# Patient Record
Sex: Female | Born: 1985
Health system: Southern US, Community
[De-identification: ages and names within clinical notes are randomized; demographics above are authoritative.]

## PROBLEM LIST (undated history)

## (undated) DIAGNOSIS — T7840XA Allergy, unspecified, initial encounter: Secondary | ICD-10-CM

## (undated) DIAGNOSIS — F419 Anxiety disorder, unspecified: Secondary | ICD-10-CM

## (undated) DIAGNOSIS — M797 Fibromyalgia: Secondary | ICD-10-CM

## (undated) HISTORY — DX: Fibromyalgia: M79.7

## (undated) HISTORY — DX: Allergy, unspecified, initial encounter: T78.40XA

## (undated) HISTORY — DX: Anxiety disorder, unspecified: F41.9

---

## 2007-01-18 ENCOUNTER — Emergency Department (HOSPITAL_COMMUNITY): Admission: EM | Admit: 2007-01-18 | Discharge: 2007-01-19 | Payer: Self-pay | Admitting: Emergency Medicine

## 2008-06-10 ENCOUNTER — Emergency Department (HOSPITAL_COMMUNITY): Admission: EM | Admit: 2008-06-10 | Discharge: 2008-06-10 | Payer: Self-pay | Admitting: Emergency Medicine

## 2010-05-15 ENCOUNTER — Encounter: Admission: RE | Admit: 2010-05-15 | Discharge: 2010-05-15 | Payer: Self-pay | Admitting: Internal Medicine

## 2012-05-16 ENCOUNTER — Ambulatory Visit
Admission: RE | Admit: 2012-05-16 | Discharge: 2012-05-16 | Disposition: A | Discharge status: Home / Self Care | Payer: 59 | Source: Ambulatory Visit | Attending: Family Medicine | Admitting: Family Medicine

## 2012-05-16 ENCOUNTER — Other Ambulatory Visit: Payer: Self-pay | Admitting: Family Medicine

## 2012-05-16 ENCOUNTER — Encounter: Payer: Self-pay | Admitting: Family Medicine

## 2012-05-16 ENCOUNTER — Other Ambulatory Visit (HOSPITAL_COMMUNITY)
Admission: RE | Admit: 2012-05-16 | Discharge: 2012-05-16 | Disposition: A | Discharge status: Home / Self Care | Payer: 59 | Source: Ambulatory Visit | Attending: Family Medicine | Admitting: Family Medicine

## 2012-05-16 ENCOUNTER — Ambulatory Visit (INDEPENDENT_AMBULATORY_CARE_PROVIDER_SITE_OTHER): Payer: 59 | Admitting: Family Medicine

## 2012-05-16 VITALS — BP 100/90 | HR 76 | Temp 98.1°F | Ht 67.75 in | Wt 153.0 lb

## 2012-05-16 DIAGNOSIS — R1032 Left lower quadrant pain: Secondary | ICD-10-CM

## 2012-05-16 DIAGNOSIS — G43009 Migraine without aura, not intractable, without status migrainosus: Secondary | ICD-10-CM | POA: Insufficient documentation

## 2012-05-16 DIAGNOSIS — R1031 Right lower quadrant pain: Secondary | ICD-10-CM

## 2012-05-16 DIAGNOSIS — Z01419 Encounter for gynecological examination (general) (routine) without abnormal findings: Secondary | ICD-10-CM | POA: Insufficient documentation

## 2012-05-16 DIAGNOSIS — D219 Benign neoplasm of connective and other soft tissue, unspecified: Secondary | ICD-10-CM

## 2012-05-16 LAB — CBC WITH DIFFERENTIAL/PLATELET
Lymphs Abs: 1.6 10*3/uL (ref 0.7–4.0)
MCHC: 33 g/dL (ref 30.0–36.0)
Monocytes Absolute: 0.3 10*3/uL (ref 0.1–1.0)
Platelets: 288 10*3/uL (ref 150.0–400.0)
RBC: 3.65 Mil/uL — ABNORMAL LOW (ref 3.87–5.11)
RDW: 14.1 % (ref 11.5–14.6)
WBC: 4.6 10*3/uL (ref 4.5–10.5)

## 2012-05-16 LAB — COMPREHENSIVE METABOLIC PANEL
Albumin: 4 g/dL (ref 3.5–5.2)
BUN: 16 mg/dL (ref 6–23)
CO2: 28 mEq/L (ref 19–32)
GFR: 111.71 mL/min (ref >60.00)
Glucose, Bld: 73 mg/dL (ref 70–99)
Potassium: 4.1 mEq/L (ref 3.5–5.1)

## 2012-05-16 NOTE — Patient Instructions (Signed)
Please stop by to see Shirlee Limerick on your way out to set up your ultrasound. We will call you with your lab results, pap smear and ultrasound results.

## 2012-05-16 NOTE — Progress Notes (Signed)
Subjective:    Patient ID: Beth Cox, female    DOB: 1986/08/11, 26 y.o.   MRN: [id number]  HPI  26 yo female here to establish care.  G0, has not been sexually active in over five years.  Overdue for pap smear. Denies any dysuria or vaginal discharge.  Abdominal pain- past 3 years- intermittent typically LLQ pain but sometimes radiates to RLQ.  Per pt, has been to multiple urgent care facilities during the past 3 years for this complaint and all tests, including pelvic ultrasound 3 years ago were neg.  Pain is sometimes random, other time occurs the week before her menstrual period.  "Twisting" feeling.  Sometimes associated with nausea, no vomiting. No abnormal vaginal bleeding, constipation, diarrhea, blood or mucous in stool. Sometimes occurs with a migraine.  Migraines- has one every few months.  Associated with photophobia, nausea but no vomiting.  Patient Active Problem List  Diagnoses  . Right lower quadrant pain  . Gynecological examination  . Left lower quadrant pain  . Migraine   Past Medical History  Diagnosis Date  . Migraine    No past surgical history on file. History  Substance Use Topics  . Smoking status: Never Smoker   . Smokeless tobacco: Not on file  . Alcohol Use: Not on file   No family history on file. No Known Allergies No current outpatient prescriptions on file prior to visit.   The PMH, PSH, Social History, Family History, Medications, and allergies have been reviewed in [facility], and have been updated if relevant.   Review of Systems    See HPI Patient reports no  vision/ hearing changes,anorexia, weight change, fever ,adenopathy, persistant / recurrent hoarseness, swallowing issues, chest pain, edema,persistant / recurrent cough, hemoptysis, dyspnea(rest, exertional, paroxysmal nocturnal), gastrointestinal  bleeding (melena, rectal bleeding), abdominal pain, excessive heart burn, GU symptoms(dysuria, hematuria, pyuria, voiding/incontinence   Issues) syncope, focal weakness, severe memory loss, concerning skin lesions, depression, anxiety, abnormal bruising/bleeding, major joint swelling, breast masses or abnormal vaginal bleeding.    Objective:   Physical Exam BP 100/90  Pulse 76  Temp(Src) 98.1 F (36.7 C) (Oral)  Ht 5' 7.75" (1.721 m)  Wt 153 lb (69.4 kg)  BMI 23.44 kg/m2  LMP 05/08/2012  General:  Well-developed,well-nourished,in no acute distress; alert,appropriate and cooperative throughout examination Head:  normocephalic and atraumatic.   Eyes:  vision grossly intact, pupils equal, pupils round, and pupils reactive to light.   Ears:  R ear normal and L ear normal.   Nose:  no external deformity.   Mouth:  good dentition.   Neck:  No deformities, masses, or tenderness noted. Lungs:  Normal respiratory effort, chest expands symmetrically. Lungs are clear to auscultation, no crackles or wheezes. Heart:  Normal rate and regular rhythm. S1 and S2 normal without gallop, murmur, click, rub or other extra sounds. Abdomen:  Bowel sounds positive,abdomen soft and non-tender without masses, organomegaly or hernias noted. Rectal:  no external abnormalities.   Genitalia:  Pelvic Exam:        External: normal female genitalia without lesions or masses        Vagina: normal without lesions or masses        Cervix: normal without lesions or masses        Adnexa: normal bimanual exam without masses or fullness        Uterus: normal by palpation        Pap smear: performed Msk:  No deformity or scoliosis noted of thoracic or lumbar  spine.   Extremities:  No clubbing, cyanosis, edema, or deformity noted with normal full range of motion of all joints.   Neurologic:  alert & oriented X3 and gait normal.   Skin:  Intact without suspicious lesions or rashes Cervical Nodes:  No lymphadenopathy noted Axillary Nodes:  No palpable lymphadenopathy Psych:  Cognition and judgment appear intact. Alert and cooperative with normal attention  span and concentration. No apparent delusions, illusions, hallucinations     Assessment & Plan:   1. Gynecological examination  Reviewed preventive care protocols, scheduled due services, and updated immunizations   Cytology - PAP  2. Left lower quadrant pain  Differential is very wide- ?possible abdominal migraines since she does have typical migraines as well. ?Ovarian cyst. Unlikely IBD- no bowel changes. Will check CBC, CMET, pelvic ultrasound as initial evaluation.  US Pelvis Complete, US Transvaginal Non-OB CBC with Differential, Comprehensive metabolic panel

## 2012-05-23 ENCOUNTER — Encounter: Payer: Self-pay | Admitting: *Deleted

## 2012-05-23 ENCOUNTER — Encounter: Payer: Self-pay | Admitting: Family Medicine

## 2012-05-23 LAB — HM PAP SMEAR: HM Pap smear: NORMAL

## 2014-07-03 ENCOUNTER — Emergency Department: Payer: Self-pay | Admitting: Emergency Medicine

## 2014-07-03 LAB — COMPREHENSIVE METABOLIC PANEL
ALBUMIN: 3.8 g/dL (ref 3.4–5.0)
ALK PHOS: 55 U/L
ALT: 20 U/L (ref 12–78)
Anion Gap: 8 (ref 7–16)
BILIRUBIN TOTAL: 0.2 mg/dL (ref 0.2–1.0)
BUN: 15 mg/dL (ref 7–18)
CO2: 28 mmol/L (ref 21–32)
Calcium, Total: 9.2 mg/dL (ref 8.5–10.1)
Chloride: 100 mmol/L (ref 98–107)
Creatinine: 0.91 mg/dL (ref 0.60–1.30)
EGFR (Non-African Amer.): >60
GLUCOSE: 81 mg/dL (ref 65–99)
Osmolality: 272 (ref 275–301)
POTASSIUM: 4.1 mmol/L (ref 3.5–5.1)
SGOT(AST): 19 U/L (ref 15–37)
SODIUM: 136 mmol/L (ref 136–145)
TOTAL PROTEIN: 7.9 g/dL (ref 6.4–8.2)

## 2014-07-03 LAB — LIPASE, BLOOD: LIPASE: 278 U/L (ref 73–393)

## 2014-07-04 LAB — CBC WITH DIFFERENTIAL/PLATELET
BASOS ABS: 0 10*3/uL (ref 0.0–0.1)
Basophil %: 0.5 %
Eosinophil #: 0.1 10*3/uL (ref 0.0–0.7)
Eosinophil %: 1 %
HCT: 37.1 % (ref 35.0–47.0)
HGB: 12.3 g/dL (ref 12.0–16.0)
Lymphocyte #: 3.1 10*3/uL (ref 1.0–3.6)
Lymphocyte %: 36.7 %
MCH: 31.2 pg (ref 26.0–34.0)
MCHC: 33.2 g/dL (ref 32.0–36.0)
MCV: 94 fL (ref 80–100)
Monocyte #: 0.5 x10 3/mm (ref 0.2–0.9)
Monocyte %: 6 %
NEUTROS PCT: 55.8 %
Neutrophil #: 4.7 10*3/uL (ref 1.4–6.5)
Platelet: 249 10*3/uL (ref 150–440)
RBC: 3.95 10*6/uL (ref 3.80–5.20)
RDW: 13.4 % (ref 11.5–14.5)
WBC: 8.4 10*3/uL (ref 3.6–11.0)

## 2014-07-04 LAB — URINALYSIS, COMPLETE
Bilirubin,UR: NEGATIVE
Blood: NEGATIVE
GLUCOSE, UR: NEGATIVE mg/dL (ref 0–75)
Ketone: NEGATIVE
Leukocyte Esterase: NEGATIVE
Nitrite: NEGATIVE
PH: 6 (ref 4.5–8.0)
PROTEIN: NEGATIVE
SPECIFIC GRAVITY: 1.014 (ref 1.003–1.030)
WBC UR: NONE SEEN /HPF (ref 0–5)

## 2015-06-04 ENCOUNTER — Encounter: Payer: Self-pay | Admitting: Family Medicine

## 2015-06-04 ENCOUNTER — Ambulatory Visit (INDEPENDENT_AMBULATORY_CARE_PROVIDER_SITE_OTHER): Payer: BLUE CROSS/BLUE SHIELD | Admitting: Family Medicine

## 2015-06-04 ENCOUNTER — Encounter (INDEPENDENT_AMBULATORY_CARE_PROVIDER_SITE_OTHER): Payer: Self-pay

## 2015-06-04 ENCOUNTER — Ambulatory Visit: Payer: BLUE CROSS/BLUE SHIELD | Admitting: Family Medicine

## 2015-06-04 VITALS — BP 112/68 | HR 74 | Temp 97.8°F | Resp 18 | Ht 67.5 in | Wt 159.6 lb

## 2015-06-04 DIAGNOSIS — N911 Secondary amenorrhea: Secondary | ICD-10-CM

## 2015-06-04 DIAGNOSIS — F419 Anxiety disorder, unspecified: Secondary | ICD-10-CM

## 2015-06-04 DIAGNOSIS — N3090 Cystitis, unspecified without hematuria: Secondary | ICD-10-CM | POA: Diagnosis not present

## 2015-06-04 HISTORY — DX: Anxiety disorder, unspecified: F41.9

## 2015-06-04 LAB — POCT URINALYSIS DIPSTICK
Bilirubin, UA: NEGATIVE
Blood, UA: NEGATIVE
GLUCOSE UA: NEGATIVE
KETONES UA: NEGATIVE
Nitrite, UA: NEGATIVE
PH UA: 6.5
Protein, UA: NEGATIVE
Spec Grav, UA: <=1.005
Urobilinogen, UA: 0.2

## 2015-06-04 LAB — POCT URINE PREGNANCY: PREG TEST UR: NEGATIVE

## 2015-06-04 MED ORDER — NITROFURANTOIN MONOHYD MACRO 100 MG PO CAPS: 100.0000 mg | ORAL_CAPSULE | Freq: Two times a day (BID) | ORAL | Status: DC

## 2015-06-04 MED ORDER — ALPRAZOLAM 0.25 MG PO TABS: 0.2500 mg | ORAL_TABLET | Freq: Every evening | ORAL | Status: DC | PRN

## 2015-06-04 NOTE — Progress Notes (Signed)
   Subjective:    Patient ID: Beth Cox, female    DOB: 1986/01/11, 29 y.o.   MRN: [id number]  HPI    Review of Systems     Objective:   Physical Exam        Assessment & Plan:

## 2015-06-04 NOTE — Progress Notes (Signed)
Name: Beth Cox   MRN: [id number]    DOB: 17-Oct-1986   Date:06/04/2015       Progress Note  Subjective  Chief Complaint  Chief Complaint  Patient presents with  . Establish Care  . Chest Pain  . Urinary Tract Infection    nausea, hematuria, right flank pain, headaches, bodyaches    HPI  Dysuria: Patient is here today with concerns regarding the following symptoms burning with urination, dysuria, right flank pain, frequency, hematuria and nausea that started days ago.  Associated with headaches. Not associated with fevers, chills.   Chest Pain: Patient complains of chest pain. Onset was several months ago, with stable course since that time. The patient describes the pain as intermittent, sharp in nature, left shoulder. Patient rates pain as a 3/10 in intensity.  Associated symptoms are none. Aggravating factors are deep inspiration and emotional stress.  Alleviating factors are: NSAIDs, rest and unpredictable spontaneous resolution. Patient's cardiac risk factors are none.  Patient's risk factors for DVT/PE: oral contraceptive use. Previous cardiac testing: chest x-ray, cholesterol, electrocardiogram (ECG), kidney function, potassium, thyroid function, triglycerides and urinalysis.     Past Medical History  Diagnosis Date  . Migraine   . Allergy     History  Substance Use Topics  . Smoking status: Never Smoker   . Smokeless tobacco: Not on file  . Alcohol Use: No     Current outpatient prescriptions:  .  Norgestimate-Ethinyl Estradiol Triphasic 0.18/0.215/0.25 MG-35 MCG tablet, Take by mouth., Disp: , Rfl:   Allergies  Allergen Reactions  . Shellfish Allergy Other (See Comments)    ROS  10 Systems reviewed and is negative except as mentioned in HPI.   Objective  Filed Vitals:   06/04/15 1635  BP: 112/68  Pulse: 74  Temp: 97.8 F (36.6 C)  TempSrc: Oral  Resp: 18  Height: 5' 7.5" (1.715 m)  Weight: 159 lb 9.6 oz (72.394 kg)  SpO2: 95%      Physical Exam  Constitutional: Patient appears well-developed and well-nourished. In no acute distress but does appear to be uncomfortable from acute illness. Cardiovascular: Normal rate, regular rhythm and normal heart sounds.  No murmur heard.  Pulmonary/Chest: Effort normal and breath sounds normal. No respiratory distress. Abdomen: Soft with normal bowel sounds, mild tenderness on deep palpation over suprapubic area, no reproducible flank tenderness bilaterally.  Genitourinary: Exam deferred. Skin: Skin is warm and dry. No rash noted. No erythema.  Psychiatric: Patient has a normal mood and affect. Behavior is normal in office today. Judgment and thought content normal in office today.   Assessment & Plan  1. Cystitis Increase hydration, may use AZO prn. Will send urine for culture along with GC/Chlamydia probe.  - POCT Urinalysis Dipstick - Urine Culture - GC/chlamydia probe amp, urine - nitrofurantoin, macrocrystal-monohydrate, (MACROBID) 100 MG capsule; Take 1 capsule (100 mg total) by mouth 2 (two) times daily.  Dispense: 10 capsule; Refill: 0  2. Anxiety, mild I have reviewed available previous medical records found in the EMR. Madissen's chest pain does not fit cardiac etiology and she has no notable risk factors at this time. Per Cyril Mourning she has had routine blood work done at work yesterday and her cholesterol panel was normal. I have discussed other possible etiologies such as musculoskeletal chest wall pain vs anxiety. Plan to treat with low dose benzo prn as a trial.    Other less likely etiologies I have considered is PE (although her symptoms are too chronic and intermittent).   -  ALPRAZolam (XANAX) 0.25 MG tablet; Take 1 tablet (0.25 mg total) by mouth at bedtime as needed for anxiety.  Dispense: 20 tablet; Refill: 0  3. Amenorrhea, secondary Negative   - POCT urine pregnancy

## 2015-06-05 ENCOUNTER — Other Ambulatory Visit: Payer: Self-pay | Admitting: Family Medicine

## 2015-06-06 LAB — URINE CULTURE

## 2015-06-12 LAB — GC/CHLAMYDIA PROBE AMP
CHLAMYDIA, DNA PROBE: NEGATIVE
NEISSERIA GONORRHOEAE BY PCR: NEGATIVE

## 2015-06-12 LAB — SPECIMEN STATUS REPORT

## 2015-07-21 ENCOUNTER — Encounter (INDEPENDENT_AMBULATORY_CARE_PROVIDER_SITE_OTHER): Payer: Self-pay

## 2015-07-25 ENCOUNTER — Encounter: Payer: Self-pay | Admitting: Family Medicine

## 2015-07-25 ENCOUNTER — Ambulatory Visit (INDEPENDENT_AMBULATORY_CARE_PROVIDER_SITE_OTHER): Payer: BLUE CROSS/BLUE SHIELD | Admitting: Family Medicine

## 2015-07-25 VITALS — BP 110/70 | HR 69 | Temp 98.2°F | Resp 16 | Ht 68.0 in | Wt 160.8 lb

## 2015-07-25 DIAGNOSIS — R5383 Other fatigue: Secondary | ICD-10-CM | POA: Diagnosis not present

## 2015-07-25 DIAGNOSIS — J45909 Unspecified asthma, uncomplicated: Secondary | ICD-10-CM

## 2015-07-25 DIAGNOSIS — R0982 Postnasal drip: Secondary | ICD-10-CM | POA: Diagnosis not present

## 2015-07-25 DIAGNOSIS — R109 Unspecified abdominal pain: Secondary | ICD-10-CM

## 2015-07-25 DIAGNOSIS — R05 Cough: Secondary | ICD-10-CM | POA: Diagnosis not present

## 2015-07-25 DIAGNOSIS — R059 Cough, unspecified: Secondary | ICD-10-CM

## 2015-07-25 DIAGNOSIS — R350 Frequency of micturition: Secondary | ICD-10-CM | POA: Diagnosis not present

## 2015-07-25 DIAGNOSIS — J309 Allergic rhinitis, unspecified: Secondary | ICD-10-CM

## 2015-07-25 MED ORDER — FLUTICASONE PROPIONATE 50 MCG/ACT NA SUSP: 2.0000 | Freq: Every day | NASAL | Status: DC

## 2015-07-25 MED ORDER — LORATADINE 10 MG PO TABS: 10.0000 mg | ORAL_TABLET | Freq: Every day | ORAL | Status: DC

## 2015-07-25 NOTE — Progress Notes (Signed)
Name: Beth Cox   MRN: [id number]    DOB: Sep 02, 1986   Date:07/25/2015       Progress Note  Subjective  Chief Complaint  Chief Complaint  Patient presents with  . Fatigue    patient has been really achy, itchy and has had some rashes to pop out sporadically.  . Cough    patient stated that she has been coughing for over a month and has had some sore throat.    HPI   Beth Cox is a 29 year old health female here today with many complaints which she is not sure is related not. She states she has had a cough for many months, dry and non productive. Not associated with nasal congestion, fevers, chills, SOB, itchy eyes, ear pain, hemoptysis. She has not used any anti-histamines regularly. She also gets itchy skin on and off without overt rash. She continues to have frequent urination which is not always painful. She went to urgent care and they put her on more antibiotics. The urine cx from last month was unremarkable. She wants to see a Dealer. She also feels tired and run down with dry skin.   Patient Active Problem List   Diagnosis Date Noted  . Anxiety, mild 06/04/2015  . Cystitis 06/04/2015  . Amenorrhea, secondary 06/04/2015  . Right lower quadrant pain 05/16/2012  . Gynecological examination 05/16/2012  . Left lower quadrant pain 05/16/2012  . Migraine     History  Substance Use Topics  . Smoking status: Never Smoker   . Smokeless tobacco: Not on file  . Alcohol Use: No     Current outpatient prescriptions:  .  ALPRAZolam (XANAX) 0.25 MG tablet, Take 1 tablet (0.25 mg total) by mouth at bedtime as needed for anxiety., Disp: 20 tablet, Rfl: 0 .  ciprofloxacin (CIPRO) 500 MG tablet, Take 500 mg by mouth., Disp: , Rfl:  .  Norgestimate-Ethinyl Estradiol Triphasic 0.18/0.215/0.25 MG-35 MCG tablet, Take by mouth., Disp: , Rfl:   Allergies  Allergen Reactions  . Shellfish Allergy Other (See Comments)  . Apple   . Peanuts  [Peanut Oil]     Other reaction(s):  SHORTNESS OF BREATH  . Shellfish-Derived Products     Other reaction(s): Other (See Comments)    Review of Systems  CONSTITUTIONAL: No significant weight changes, fever, chills, weakness. Yes fatigue HEENT:  - Eyes: No visual changes.  - Ears: No auditory changes. No pain.  - Nose: No sneezing, congestion, runny nose. - Throat: No sore throat. No changes in swallowing. SKIN: No rash or itching.  CARDIOVASCULAR: No chest pain, chest pressure or chest discomfort. No palpitations or edema.  RESPIRATORY: No shortness of breath. Yes cough. GASTROINTESTINAL: No anorexia, nausea, vomiting. No changes in bowel habits. No abdominal pain or blood.  GENITOURINARY: Yes frequency without pain. NEUROLOGICAL: No headache, dizziness, syncope, paralysis, ataxia, numbness or tingling in the extremities. No memory changes. No change in bowel or bladder control.  MUSCULOSKELETAL: No joint pain. No muscle pain. HEMATOLOGIC: No anemia, bleeding or bruising.  LYMPHATICS: No enlarged lymph nodes.  PSYCHIATRIC: No change in mood. No change in sleep pattern.  ENDOCRINOLOGIC: No reports of sweating, cold or heat intolerance. No polyuria or polydipsia. Yes dryness to skin.  Depression screen [facility] 2/9 06/04/2015  Decreased Interest 0  Down, Depressed, Hopeless 0  PHQ - 2 Score 0    Objective  BP 110/70 mmHg  Pulse 69  Temp(Src) 98.2 F (36.8 C) (Oral)  Resp 16  Ht 5'  8" (1.727 m)  Wt 160 lb 12.8 oz (72.938 kg)  BMI 24.46 kg/m2  SpO2 98%  Body mass index is 24.46 kg/(m^2).   Physical Exam  Constitutional: Patient appears well-developed and well-nourished. In no distress.  HEENT:  - Head: Normocephalic and atraumatic.  - Ears: Bilateral TMs gray, no erythema or effusion - Nose: Nasal mucosa moist boggy and congested. - Mouth/Throat: Oropharynx is clear and moist. No tonsillar hypertrophy or erythema. Yes post nasal drainage.  - Eyes: Conjunctivae clear, EOM movements normal. PERRLA. No scleral  icterus.  Neck: Normal range of motion. Neck supple. No JVD present. No thyromegaly present.  Cardiovascular: Normal rate, regular rhythm and normal heart sounds.  No murmur heard.  Pulmonary/Chest: Effort normal and breath sounds normal. No respiratory distress. Musculoskeletal: Normal range of motion bilateral UE and LE, no joint effusions. Peripheral vascular: Bilateral LE no edema. Neurological: CN II-XII grossly intact with no focal deficits. Alert and oriented to person, place, and time. Coordination, balance, strength, speech and gait are normal.  Skin: Skin is warm and dry. No rash noted. No erythema.  Psychiatric: Patient has a normal mood and affect. Behavior is normal in office today. Judgment and thought content normal in office today.   Recent Results (from the past 2160 hour(s))  POCT Urinalysis Dipstick     Status: Abnormal   Collection Time: 06/04/15  5:01 PM  Result Value Ref Range   Color, UA Yellow    Clarity, UA Clear    Glucose, UA Negative    Bilirubin, UA Negative    Ketones, UA Negative    Spec Grav, UA <=1.005    Blood, UA Negative    pH, UA 6.5    Protein, UA Negative    Urobilinogen, UA 0.2    Nitrite, UA Negative    Leukocytes, UA large (3+)   POCT urine pregnancy     Status: Normal   Collection Time: 06/04/15  5:30 PM  Result Value Ref Range   Preg Test, Ur Negative   Urine Culture     Status: None   Collection Time: 06/05/15 12:00 AM  Result Value Ref Range   Urine Culture, Routine Final report    Result 1 Comment     Comment: Mixed urogenital flora Less than 10,000 colonies/mL   GC/Chlamydia Probe Amp     Status: None   Collection Time: 06/05/15 12:00 AM  Result Value Ref Range   Chlamydia trachomatis, NAA Negative Negative   Neisseria gonorrhoeae by PCR Negative Negative  Specimen status report     Status: None   Collection Time: 06/05/15 12:00 AM  Result Value Ref Range   specimen status report Comment     Comment: Written  Authorization Written Authorization Written Authorization Received. Authorization received from original requisition 06-06-2015 Logged by [place]  1. Flank pain Clinical suspicion for kidney stone and pyelonephritis low but she is quite concerned so I will order an abdominal US.  - DG Abd 2 Views; Future  2. Cough Likely related to allergies. Will have her start flonase and anti-histamine and see allergist near future if this does not help. Will get CXR to rule out mass or inflammation.   - DG Chest 2 View; Future  3. Urinary frequency Unclear etiology. Urine STD check negative and Ucx last mont unremarkable. Will refer to Urology.  - Ambulatory referral to Urology  4. Allergic rhinitis with postnasal drip Start claritin and flonase.  -  loratadine (CLARITIN) 10 MG tablet; Take 1 tablet (10 mg total) by mouth daily.  Dispense: 30 tablet; Refill: 5 - fluticasone (FLONASE) 50 MCG/ACT nasal spray; Place 2 sprays into both nostrils daily.  Dispense: 16 g; Refill: 5  5. Other fatigue Non specific vague symptoms. Clinically she is stable. Will rule out thyroid disorder. Also will keep sub-optimal control of mood disorder in mind.  - T3, free - T4, free - TSH - B12 and Folate Panel

## 2015-07-26 LAB — TSH: TSH: 1.36 u[IU]/mL (ref 0.450–4.500)

## 2015-07-26 LAB — B12 AND FOLATE PANEL
FOLATE: 17.3 ng/mL (ref >3.0)
Vitamin B-12: 611 pg/mL (ref 211–946)

## 2015-07-26 LAB — T4, FREE: FREE T4: 1.4 ng/dL (ref 0.82–1.77)

## 2015-07-26 LAB — T3, FREE: T3, Free: 2.9 pg/mL (ref 2.0–4.4)

## 2015-07-29 ENCOUNTER — Ambulatory Visit
Admission: RE | Admit: 2015-07-29 | Discharge: 2015-07-29 | Disposition: A | Discharge status: Home / Self Care | Payer: BLUE CROSS/BLUE SHIELD | Source: Ambulatory Visit | Attending: Family Medicine | Admitting: Family Medicine

## 2015-07-29 DIAGNOSIS — R05 Cough: Secondary | ICD-10-CM

## 2015-07-29 DIAGNOSIS — R109 Unspecified abdominal pain: Secondary | ICD-10-CM | POA: Insufficient documentation

## 2015-07-29 DIAGNOSIS — R059 Cough, unspecified: Secondary | ICD-10-CM

## 2015-07-30 ENCOUNTER — Encounter: Payer: Self-pay | Admitting: Family Medicine

## 2015-08-19 ENCOUNTER — Ambulatory Visit: Payer: BLUE CROSS/BLUE SHIELD | Admitting: Urology

## 2015-08-19 ENCOUNTER — Encounter: Payer: Self-pay | Admitting: Urology

## 2015-09-15 ENCOUNTER — Encounter: Payer: Self-pay | Admitting: Family Medicine

## 2015-09-23 ENCOUNTER — Encounter: Payer: Self-pay | Admitting: Family Medicine

## 2015-09-23 ENCOUNTER — Ambulatory Visit (INDEPENDENT_AMBULATORY_CARE_PROVIDER_SITE_OTHER): Payer: BLUE CROSS/BLUE SHIELD | Admitting: Family Medicine

## 2015-09-23 VITALS — BP 112/72 | HR 84 | Temp 97.9°F | Resp 16 | Wt 158.8 lb

## 2015-09-23 DIAGNOSIS — F411 Generalized anxiety disorder: Secondary | ICD-10-CM | POA: Diagnosis not present

## 2015-09-23 DIAGNOSIS — M797 Fibromyalgia: Secondary | ICD-10-CM | POA: Diagnosis not present

## 2015-09-23 DIAGNOSIS — G8929 Other chronic pain: Secondary | ICD-10-CM | POA: Diagnosis not present

## 2015-09-23 DIAGNOSIS — R102 Pelvic and perineal pain: Secondary | ICD-10-CM

## 2015-09-23 DIAGNOSIS — K5901 Slow transit constipation: Secondary | ICD-10-CM | POA: Diagnosis not present

## 2015-09-23 DIAGNOSIS — N949 Unspecified condition associated with female genital organs and menstrual cycle: Secondary | ICD-10-CM | POA: Diagnosis not present

## 2015-09-23 HISTORY — DX: Fibromyalgia: M79.7

## 2015-09-23 MED ORDER — DULOXETINE HCL 20 MG PO CPEP: 20.0000 mg | ORAL_CAPSULE | Freq: Every day | ORAL | Status: DC

## 2015-09-23 NOTE — Progress Notes (Signed)
Name: Beth Cox   MRN: [id number]    DOB: 1986-08-09   Date:09/23/2015       Progress Note  Subjective  Chief Complaint  Chief Complaint  Patient presents with  . Fibromyalgia    patient needs official diagnoses    HPI  Beth Cox is a 29 year old healthy female with ongoing concerns regarding symptoms such as fatigue, low motivation, pain of several areas but not necessarily joints. She is quite concerned she may have Fibromyalgia. Has not had official diagnosis however. Patient complains of anxiety disorder and poor motivation .  She has the following symptoms: difficulty concentrating, fatigue, irritable, chest wall pain, neck pain, knee pain, abdominal pain, pelvic pain, low motivation. Onset of symptoms was approximately several years ago, stable since that time. She denies current suicidal and homicidal ideation. Family history significant for none that she can recall. Possible organic causes contributing are: none. Risk factors: none that can be identified, recently married Feb 2016 and reports happy relationship. Previous treatment includes none. Regarding constipation, confirmed on X-ray on 07/29/15. She states she has bowel movements every 2-3 days but may be incomplete. Stools are formed but she states they can be dark with white mucous. Abdominal and pelvic pain are crampy but not sharp or stabbing. She has made appointment with her Gynecologist to address pelvic symptoms.   Active Ambulatory Problems    Diagnosis Date Noted  . Migraine without aura and without status migrainosus, not intractable   . Anxiety, mild 06/04/2015  . Allergic rhinitis with postnasal drip 07/25/2015  . Other fatigue 07/25/2015  . Fibromyalgia syndrome 09/23/2015  . Chronic pelvic pain in female 09/23/2015  . Constipation, slow transit 09/23/2015   Resolved Ambulatory Problems    Diagnosis Date Noted  . Right lower quadrant pain 05/16/2012  . Gynecological examination 05/16/2012  . Left  lower quadrant pain 05/16/2012  . Cystitis 06/04/2015  . Amenorrhea, secondary 06/04/2015  . Urinary frequency 07/25/2015  . Flank pain 07/25/2015  . Cough 07/25/2015   Past Medical History  Diagnosis Date  . Migraine   . Allergy     Social History  Substance Use Topics  . Smoking status: Never Smoker   . Smokeless tobacco: Not on file  . Alcohol Use: No     Current outpatient prescriptions:  .  ALPRAZolam (XANAX) 0.25 MG tablet, Take 1 tablet (0.25 mg total) by mouth at bedtime as needed for anxiety., Disp: 20 tablet, Rfl: 0 .  fluticasone (FLONASE) 50 MCG/ACT nasal spray, Place 2 sprays into both nostrils daily., Disp: 16 g, Rfl: 5 .  loratadine (CLARITIN) 10 MG tablet, Take 1 tablet (10 mg total) by mouth daily., Disp: 30 tablet, Rfl: 5 .  Norgestimate-Ethinyl Estradiol Triphasic 0.18/0.215/0.25 MG-35 MCG tablet, Take by mouth., Disp: , Rfl:   No past surgical history on file.  Family History  Problem Relation Age of Onset  . Depression Mother   . Depression Father     Allergies  Allergen Reactions  . Shellfish Allergy Other (See Comments)  . Apple   . Peanuts  [Peanut Oil]     Other reaction(s): SHORTNESS OF BREATH  . Shellfish-Derived Products     Other reaction(s): Other (See Comments)     Review of Systems  CONSTITUTIONAL: No significant weight changes, fever, chills, weakness. Yes fatigue.  CARDIOVASCULAR: No chest pain, chest pressure or chest discomfort. No palpitations or edema.  RESPIRATORY: No shortness of breath, cough or sputum.  GASTROINTESTINAL: No anorexia, nausea, vomiting.  No diarrhea. Chronic abdominal discomfort with constipation. GENITOURINARY: No dysuria. No frequency. No discharge.  NEUROLOGICAL: No headache, dizziness, syncope, paralysis, ataxia, numbness or tingling in the extremities. No memory changes. No change in bowel or bladder control.  MUSCULOSKELETAL: Yes joint pain. Yes muscle pain. HEMATOLOGIC: No anemia, bleeding or  bruising.  LYMPHATICS: No enlarged lymph nodes.  PSYCHIATRIC: Yes change in mood but not worse than usual. No change in sleep pattern.      Objective  BP 112/72 mmHg  Pulse 84  Temp(Src) 97.9 F (36.6 C) (Oral)  Resp 16  Wt 158 lb 12.8 oz (72.031 kg)  SpO2 96%  LMP 09/16/2015 (Exact Date) Body mass index is 24.15 kg/(m^2).  Physical Exam  Constitutional: Patient appears well-developed and well-nourished. In no distress.  Neck: Normal range of motion. Neck supple. No JVD present. No thyromegaly present.  Cardiovascular: Normal rate, regular rhythm and normal heart sounds.  No murmur heard.  Pulmonary/Chest: Effort normal and breath sounds normal. No respiratory distress. Abdomen: Soft, non tender, non distended, no guarding or rebound, normal bowel sounds.  Musculoskeletal: Normal range of motion bilateral UE and LE, no joint effusions. Bilateral reproducible point tenderness at posterior base of skull, posterior base of neck, at anterior lower neck just above medial clavicles, bilateral knees medial aspect, bilateral upper outer buttock, left greater than right lateral hip. No tenderness at elbows.   Peripheral vascular: Bilateral LE no edema. Neurological: CN II-XII grossly intact with no focal deficits. Alert and oriented to person, place, and time. Coordination, balance, strength, speech and gait are normal.  Skin: Skin is warm and dry. No rash noted. No erythema.  Psychiatric: Patient has a stable mood and affect. Behavior is normal in office today. Judgment and thought content normal in office today.   Assessment & Plan  1. Fibromyalgia syndrome New diagnosis, qualifying positive point testing with 12 out of 18 tender point sites on today's exam with negative routine blood panels although she has not had recent ESR or RA factor on file that I can find. No joint swelling, warmth, restricted ROM. I am less likely to consider RA or OA at this time. CXR done 07/29/15  unremarkable. Discussed new diagnosis, possible component of adjustment disorder and treatment options: Prozac, Zoloft, Wellbutrin, Cymbalta, Savella. Decided on Cymbalta. The patient has been counseled on the proper use, side effects and potential interactions of the new medication. Patient encouraged to review the side effects and safety profile pamphlet provided with the prescription from the pharmacy as well as request counseling from the pharmacy team as needed.    - DULoxetine (CYMBALTA) 20 MG capsule; Take 1 capsule (20 mg total) by mouth daily.  Dispense: 14 capsule; Refill: 0  2. Chronic pelvic pain in female May be related to Fibromyalgia. She is follow up with Gynecologist soon. Other differentials discussed with patient include endometriosis, referred pain from GI issues, ovarian cysts.   3. Constipation, slow transit May be IBS related. If symptoms persist will refer to GI.  4. GAD (generalized anxiety disorder) Likely her fibromyalgia has some component of a mood disorder without overt depressive symptoms.  - DULoxetine (CYMBALTA) 20 MG capsule; Take 1 capsule (20 mg total) by mouth daily.  Dispense: 14 capsule; Refill: 0

## 2015-09-23 NOTE — Patient Instructions (Signed)
Fibromyalgia Fibromyalgia is a disorder that is often misunderstood. It is associated with muscular pains and tenderness that comes and goes. It is often associated with fatigue and sleep disturbances. Though it tends to be long-lasting, fibromyalgia is not life-threatening. CAUSES  The exact cause of fibromyalgia is unknown. People with certain gene types are predisposed to developing fibromyalgia and other conditions. Certain factors can play a role as triggers, such as:  Spine disorders.  Arthritis.  Severe injury (trauma) and other physical stressors.  Emotional stressors. SYMPTOMS   The main symptom is pain and stiffness in the muscles and joints, which can vary over time.  Sleep and fatigue problems. Other related symptoms may include:  Bowel and bladder problems.  Headaches.  Visual problems.  Problems with odors and noises.  Depression or mood changes.  Painful periods (dysmenorrhea).  Dryness of the skin or eyes. DIAGNOSIS  There are no specific tests for diagnosing fibromyalgia. Patients can be diagnosed accurately from the specific symptoms they have. The diagnosis is made by determining that nothing else is causing the problems. TREATMENT  There is no cure. Management includes medicines and an active, healthy lifestyle. The goal is to enhance physical fitness, decrease pain, and improve sleep. HOME CARE INSTRUCTIONS   Only take over-the-counter or prescription medicines as directed by your caregiver. Sleeping pills, tranquilizers, and pain medicines may make your problems worse.  Low-impact aerobic exercise is very important and advised for treatment. At first, it may seem to make pain worse. Gradually increasing your tolerance will overcome this feeling.  Learning relaxation techniques and how to control stress will help you. Biofeedback, visual imagery, hypnosis, muscle relaxation, yoga, and meditation are all options.  Anti-inflammatory medicines and  physical therapy may provide short-term help.  Acupuncture or massage treatments may help.  Take muscle relaxant medicines as suggested by your caregiver.  Avoid stressful situations.  Plan a healthy lifestyle. This includes your diet, sleep, rest, exercise, and friends.  Find and practice a hobby you enjoy.  Join a fibromyalgia support group for interaction, ideas, and sharing advice. This may be helpful. SEEK MEDICAL CARE IF:  You are not having good results or improvement from your treatment. FOR MORE INFORMATION  National Fibromyalgia Association: www.fmaware.org Arthritis Foundation: www.arthritis.org Document Released: 12/13/2005 Document Revised: 03/06/2012 Document Reviewed: 03/25/2010 ExitCare Patient Information 2015 [employer]. This information is not intended to replace advice given to you by your health care provider. Make sure you discuss any questions you have with your health care provider.  

## 2016-06-24 ENCOUNTER — Encounter: Payer: BLUE CROSS/BLUE SHIELD | Admitting: Family Medicine

## 2016-07-15 ENCOUNTER — Ambulatory Visit (INDEPENDENT_AMBULATORY_CARE_PROVIDER_SITE_OTHER): Payer: 59 | Admitting: Family Medicine

## 2016-07-15 ENCOUNTER — Encounter: Payer: Self-pay | Admitting: Family Medicine

## 2016-07-15 VITALS — BP 104/78 | HR 87 | Temp 99.3°F | Resp 14 | Wt 164.0 lb

## 2016-07-15 DIAGNOSIS — M2550 Pain in unspecified joint: Secondary | ICD-10-CM | POA: Diagnosis not present

## 2016-07-15 DIAGNOSIS — N926 Irregular menstruation, unspecified: Secondary | ICD-10-CM

## 2016-07-15 DIAGNOSIS — I739 Peripheral vascular disease, unspecified: Secondary | ICD-10-CM | POA: Diagnosis not present

## 2016-07-15 DIAGNOSIS — R8290 Unspecified abnormal findings in urine: Secondary | ICD-10-CM

## 2016-07-15 DIAGNOSIS — R5383 Other fatigue: Secondary | ICD-10-CM

## 2016-07-15 DIAGNOSIS — R1032 Left lower quadrant pain: Secondary | ICD-10-CM

## 2016-07-15 DIAGNOSIS — N910 Primary amenorrhea: Secondary | ICD-10-CM | POA: Diagnosis not present

## 2016-07-15 LAB — POCT URINALYSIS DIPSTICK
Bilirubin, UA: NEGATIVE
Blood, UA: NEGATIVE
Glucose, UA: NEGATIVE
Ketones, UA: NEGATIVE
Nitrite, UA: NEGATIVE
PH UA: 5.5
PROTEIN UA: NEGATIVE
SPEC GRAV UA: 1.02
UROBILINOGEN UA: 0.2

## 2016-07-15 LAB — POCT URINE PREGNANCY: PREG TEST UR: NEGATIVE

## 2016-07-15 MED ORDER — NITROFURANTOIN MONOHYD MACRO 100 MG PO CAPS: 100.0000 mg | ORAL_CAPSULE | Freq: Two times a day (BID) | ORAL | Status: AC

## 2016-07-15 NOTE — Assessment & Plan Note (Signed)
Check labs 

## 2016-07-15 NOTE — Patient Instructions (Signed)
Start the antibiotic Get plenty of hydration We'll get labs today Try turmeric as a natural anti-inflammatory (for pain and arthritis). It comes in capsules where you buy aspirin and fish oil, but also as a spice where you buy pepper and garlic powder. Start aleve 1-2 pills (220 to 440 mg) every 12 hours for the next 5 days, and then just when needed, take with food

## 2016-07-15 NOTE — Progress Notes (Signed)
BP 104/78   Pulse 87   Temp 99.3 F (37.4 C) (Oral)   Resp 14   Wt 164 lb (74.4 kg)   LMP 07/08/2016 (Approximate)   SpO2 93%   BMI 24.94 kg/m     Subjective:    Patient ID: Beth Cox, female    DOB: July 30, 1986, 30 y.o.   MRN: [id number]  HPI: Beth Cox is a 30 y.o. female  Chief Complaint  Patient presents with  . Back Pain    frequent urination, lower abdominal pain  . skin irritation    itching  . Fatigue  . Fibromyalgia    joint pain   She has been having lower back pain and abdominal pain; LMP was 8 days late but finally come on, came on for only 3 days, usually longer cycle; no burning with urination; no blood in the urine; no kidneys personally or in family She has itching all over; ribs hurt across the top; no hives or red bumps; she has noticed something in the front of the chest, might be necklace; okay to wear jewelry for the most part She has had some pain in her chest; breast pain and chest; she just started noticing that a month or two ago; can feel it in the breasts; middle of the chest, like sharp and aching, sometimes under arms; no change with breathing or laughing; sometimes sore to the touch Acid reflux and gagging sensation in the throat; going on for a month Joint pain; mostly in the neck and shoulders, also in elbows and knees and hips and wrists; feels like she is 30 year old; no known family hx of RA; no known fam hx of rheumatoid disease; tick bite as a child; nothing recently; having mosquito bites, lit up the other day Fatigue; some mornings just tired, feels rough in the mornings some times; takes a light lunch, takes nap during lunch break over the last year; goes tot he bathroom a ton, 4-5 x a night; dry mouth  Depression screen [facility] 2/9 07/15/2016 09/23/2015 06/04/2015  Decreased Interest 0 0 0  Down, Depressed, Hopeless 0 0 0  PHQ - 2 Score 0 0 0   Relevant past medical, surgical, family and social history reviewed Past Medical  History:  Diagnosis Date  . Allergy   . Anxiety, mild 06/04/2015  . Fibromyalgia syndrome 09/23/2015  . Migraine    History reviewed. No pertinent surgical history.  No surgery, verified  Family History  Problem Relation Age of Onset  . Depression Mother   . Depression Father   . Breast cancer Neg Hx   MD notes: no thyroid disease, no diabetes; grandfather has had a few types of cancer, heart attack, 30 years old and still kicking  Social History  Substance Use Topics  . Smoking status: Never Smoker  . Smokeless tobacco: Not on file  . Alcohol use No   Interim medical history since last visit reviewed. Allergies and medications reviewed  Review of Systems Per HPI unless specifically indicated above     Objective:    BP 104/78   Pulse 87   Temp 99.3 F (37.4 C) (Oral)   Resp 14   Wt 164 lb (74.4 kg)   LMP 07/08/2016 (Approximate)   SpO2 93%   BMI 24.94 kg/m    Wt Readings from Last 3 Encounters:  08/02/16 164 lb 3.2 oz (74.5 kg)  07/15/16 164 lb (74.4 kg)  09/23/15 158 lb 12.8 oz (72 kg)  Physical Exam  Constitutional: She appears well-developed and well-nourished. No distress.  HENT:  Head: Normocephalic and atraumatic.  Eyes: EOM are normal. No scleral icterus.  Neck: No thyromegaly present.  Cardiovascular: Normal rate, regular rhythm and normal heart sounds.   No murmur heard. Distal hands cool to the touch  Pulmonary/Chest: Effort normal and breath sounds normal. No respiratory distress. She has no wheezes.  Abdominal: Soft. Bowel sounds are normal. She exhibits no distension.  Musculoskeletal: Normal range of motion. She exhibits no edema.  Neurological: She is alert. She exhibits normal muscle tone.  Skin: Skin is warm and dry. She is not diaphoretic. No pallor.  Psychiatric: She has a normal mood and affect. Her behavior is normal. Judgment and thought content normal.      Assessment & Plan:   Problem List Items Addressed This Visit       Cardiovascular and Mediastinum   Peripheral vascular disease, unspecified ([place])   Relevant Orders   ANA,IFA RA Diag Pnl w/rflx Tit/Patn (Completed)     Other   Other fatigue    Check labs      Relevant Orders   VITAMIN D 25 Hydroxy (Vit-D Deficiency, Fractures) (Completed)   Vitamin B12 (Completed)   TSH (Completed)   Menstrual period late   Relevant Orders   POCT urine pregnancy (Completed)   LLQ abdominal pain   Relevant Orders   CBC with Differential/Platelet (Completed)   Comprehensive metabolic panel (Completed)    Other Visit Diagnoses    Arthralgia of multiple sites, bilateral    -  Primary   Relevant Orders   ANA,IFA RA Diag Pnl w/rflx Tit/Patn (Completed)   C-reactive protein (Completed)   B. burgdorfi antibodies   ENA 9 Panel   Bad odor of urine       Relevant Orders   POCT urinalysis dipstick (Completed)   Urine Culture (Completed)   Left lower quadrant pain       Relevant Orders   POCT urinalysis dipstick (Completed)   Urine Culture (Completed)   CBC with Differential/Platelet (Completed)   Comprehensive metabolic panel (Completed)      Follow up plan: Return in about 2 weeks (around 07/29/2016) for lab review and further work-up.  An after-visit summary was printed and given to the patient at [place].  Please see the patient instructions which may contain other information and recommendations beyond what is mentioned above in the assessment and plan.  Meds ordered this encounter  Medications  . nitrofurantoin, macrocrystal-monohydrate, (MACROBID) 100 MG capsule    Sig: Take 1 capsule (100 mg total) by mouth 2 (two) times daily.    Dispense:  6 capsule    Refill:  0    Orders Placed This Encounter  Procedures  . Urine Culture  . ANA,IFA RA Diag Pnl w/rflx Tit/Patn  . C-reactive protein  . B. burgdorfi antibodies  . CBC with Differential/Platelet  . Comprehensive metabolic panel  . VITAMIN D 25 Hydroxy (Vit-D Deficiency, Fractures)  . Vitamin  B12  . TSH  . ENA 9 Panel  . Anti-DNA antibody, double-stranded  . Lyme Ab/Western Blot Reflex  . Ribosomal P Protein Ab  . Centromere Antibodies  . Anti-scleroderma antibody  . Jo-1 antibody-IgG  . Sjogrens syndrome-A extractable nuclear antibody  . Sjogrens syndrome-B extractable nuclear antibody  . Anti-Smith antibody  . Anti-ribonucleic acid antibody  . Anti-nuclear ab-titer (ANA titer)  . POCT urine pregnancy  . POCT urinalysis dipstick

## 2016-07-16 LAB — CBC WITH DIFFERENTIAL/PLATELET
BASOS ABS: 53 {cells}/uL (ref 0–200)
Basophils Relative: 1 %
EOS PCT: 1 %
Eosinophils Absolute: 53 cells/uL (ref 15–500)
HCT: 36.9 % (ref 35.0–45.0)
HEMOGLOBIN: 11.9 g/dL (ref 11.7–15.5)
LYMPHS ABS: 1961 {cells}/uL (ref 850–3900)
Lymphocytes Relative: 37 %
MCH: 28.9 pg (ref 27.0–33.0)
MCHC: 32.2 g/dL (ref 32.0–36.0)
MCV: 89.6 fL (ref 80.0–100.0)
MONOS PCT: 6 %
MPV: 10.9 fL (ref 7.5–12.5)
Monocytes Absolute: 318 cells/uL (ref 200–950)
NEUTROS ABS: 2915 {cells}/uL (ref 1500–7800)
NEUTROS PCT: 55 %
PLATELETS: 338 10*3/uL (ref 140–400)
RBC: 4.12 MIL/uL (ref 3.80–5.10)
RDW: 14.3 % (ref 11.0–15.0)
WBC: 5.3 10*3/uL (ref 3.8–10.8)

## 2016-07-16 LAB — COMPREHENSIVE METABOLIC PANEL
ALK PHOS: 59 U/L (ref 33–115)
ALT: 22 U/L (ref 6–29)
AST: 26 U/L (ref 10–30)
Albumin: 4.3 g/dL (ref 3.6–5.1)
BILIRUBIN TOTAL: 0.4 mg/dL (ref 0.2–1.2)
BUN: 10 mg/dL (ref 7–25)
CO2: 26 mmol/L (ref 20–31)
CREATININE: 0.85 mg/dL (ref 0.50–1.10)
Calcium: 9.3 mg/dL (ref 8.6–10.2)
Chloride: 102 mmol/L (ref 98–110)
GLUCOSE: 75 mg/dL (ref 65–99)
Potassium: 4.2 mmol/L (ref 3.5–5.3)
SODIUM: 137 mmol/L (ref 135–146)
Total Protein: 7.5 g/dL (ref 6.1–8.1)

## 2016-07-16 LAB — RIBOSOMAL P PROTEIN AB: Ribosomal P Protein Ab: <1

## 2016-07-16 LAB — TSH: TSH: 1.07 mIU/L

## 2016-07-16 LAB — SJOGRENS SYNDROME-B EXTRACTABLE NUCLEAR ANTIBODY: SSB (La) (ENA) Antibody, IgG: <1

## 2016-07-16 LAB — ANTI-SMITH ANTIBODY: ENA SM AB SER-ACNC: NEGATIVE

## 2016-07-16 LAB — ANTI-NUCLEAR AB-TITER (ANA TITER): ANA Titer 1: 1:40 {titer} — ABNORMAL HIGH

## 2016-07-16 LAB — VITAMIN B12: Vitamin B-12: 633 pg/mL (ref 200–1100)

## 2016-07-16 LAB — VITAMIN D 25 HYDROXY (VIT D DEFICIENCY, FRACTURES): Vit D, 25-Hydroxy: 29 ng/mL — ABNORMAL LOW (ref 30–100)

## 2016-07-16 LAB — ANA,IFA RA DIAG PNL W/RFLX TIT/PATN
Anti Nuclear Antibody(ANA): POSITIVE — AB
Rhuematoid fact SerPl-aCnc: <10 IU/mL (ref <=14)

## 2016-07-16 LAB — ANTI-SCLERODERMA ANTIBODY: SCLERODERMA (SCL-70) (ENA) ANTIBODY, IGG: NEGATIVE

## 2016-07-16 LAB — JO-1 ANTIBODY-IGG: JO-1 ANTIBODY, IGG: NEGATIVE

## 2016-07-16 LAB — SJOGRENS SYNDROME-A EXTRACTABLE NUCLEAR ANTIBODY: SSA (RO) (ENA) ANTIBODY, IGG: NEGATIVE

## 2016-07-16 LAB — ANTI-RIBONUCLEIC ACID ANTIBODY: SM/RNP: NEGATIVE

## 2016-07-16 LAB — LYME AB/WESTERN BLOT REFLEX: B burgdorferi Ab IgG+IgM: <0.9 Index (ref <0.90)

## 2016-07-16 LAB — C-REACTIVE PROTEIN

## 2016-07-16 LAB — CENTROMERE ANTIBODIES: Centromere Ab Screen: <1

## 2016-07-16 LAB — ANTI-DNA ANTIBODY, DOUBLE-STRANDED: ds DNA Ab: 4 IU/mL

## 2016-07-17 LAB — URINE CULTURE

## 2016-07-22 ENCOUNTER — Other Ambulatory Visit: Payer: Self-pay | Admitting: Family Medicine

## 2016-07-22 DIAGNOSIS — R768 Other specified abnormal immunological findings in serum: Secondary | ICD-10-CM

## 2016-07-22 NOTE — Assessment & Plan Note (Signed)
Refer to rheum 

## 2016-08-02 ENCOUNTER — Encounter: Payer: Self-pay | Admitting: Family Medicine

## 2016-08-02 ENCOUNTER — Ambulatory Visit (INDEPENDENT_AMBULATORY_CARE_PROVIDER_SITE_OTHER): Payer: 59 | Admitting: Family Medicine

## 2016-08-02 DIAGNOSIS — N949 Unspecified condition associated with female genital organs and menstrual cycle: Secondary | ICD-10-CM | POA: Diagnosis not present

## 2016-08-02 DIAGNOSIS — G8929 Other chronic pain: Secondary | ICD-10-CM | POA: Diagnosis not present

## 2016-08-02 DIAGNOSIS — N63 Unspecified lump in breast: Secondary | ICD-10-CM | POA: Diagnosis not present

## 2016-08-02 DIAGNOSIS — R350 Frequency of micturition: Secondary | ICD-10-CM | POA: Insufficient documentation

## 2016-08-02 DIAGNOSIS — R768 Other specified abnormal immunological findings in serum: Secondary | ICD-10-CM

## 2016-08-02 DIAGNOSIS — N644 Mastodynia: Secondary | ICD-10-CM

## 2016-08-02 DIAGNOSIS — R102 Pelvic and perineal pain: Secondary | ICD-10-CM

## 2016-08-02 DIAGNOSIS — N6323 Unspecified lump in the left breast, lower outer quadrant: Secondary | ICD-10-CM

## 2016-08-02 DIAGNOSIS — N6320 Unspecified lump in the left breast, unspecified quadrant: Secondary | ICD-10-CM | POA: Insufficient documentation

## 2016-08-02 NOTE — Patient Instructions (Addendum)
Please do see the rheumatologist We'll get you in to see the urologist Avoid / limit caffeine as much as possible Avoid tea and chocolate as well We'll get the breast studies Let us know about any changes in her breast health so we can do additional studies or get you in to see a surgeon for evaluation

## 2016-08-02 NOTE — Progress Notes (Signed)
BP 102/64   Pulse 80   Temp 99 F (37.2 C) (Oral)   Resp 14   Wt 164 lb 3.2 oz (74.5 kg)   LMP 07/08/2016 (Approximate)   SpO2 92%   BMI 24.97 kg/m    Subjective:    Patient ID: Beth Cox, female    DOB: Jan 17, 1986, 30 y.o.   MRN: [id number]  HPI: Beth Cox is a 30 y.o. female  Chief Complaint  Patient presents with  . Follow-up    2 weeks   She has had multiple labs drawn and she is here to go over symptoms She does not have any pain in the center of her abdomen or suprapubic area; still doesn't feel normal; having urinary frequency, but that's been going on for a few years; she was told that might have a small bladder; dealing with this for years and it affects her QOL Goes to see rheum on Thursday for positive ANA Vit D was a little low; taking prenatal vitamin and 1,000 vitamin D No new symptoms (new aches or rash) She felt a little bump in the left breast; knows they can come and go; have felt sore for a few months, on and off with the cycles; comes and goes No nipple discharge; has some discomfort in both breasts; sharp pain in the breasts, both sides, more on the left  Relevant past medical, surgical, family and social history reviewed Past Medical History:  Diagnosis Date  . Allergy   . Anxiety, mild 06/04/2015  . Fibromyalgia syndrome 09/23/2015  . Migraine    No past surgical history on file. Family History  Problem Relation Age of Onset  . Depression Mother   . Depression Father    Social History  Substance Use Topics  . Smoking status: Never Smoker  . Smokeless tobacco: Not on file  . Alcohol use No   Interim medical history since last visit reviewed. Allergies and medications reviewed  Review of Systems Per HPI unless specifically indicated above     Objective:    BP 102/64   Pulse 80   Temp 99 F (37.2 C) (Oral)   Resp 14   Wt 164 lb 3.2 oz (74.5 kg)   LMP 07/08/2016 (Approximate)   SpO2 92%   BMI 24.97 kg/m   Wt  Readings from Last 3 Encounters:  08/02/16 164 lb 3.2 oz (74.5 kg)  07/15/16 164 lb (74.4 kg)  09/23/15 158 lb 12.8 oz (72 kg)    Physical Exam  Constitutional: She appears well-developed and well-nourished. No distress.  Eyes: EOM are normal. No scleral icterus.  Neck: No thyromegaly present.  Cardiovascular: Normal rate.   Pulmonary/Chest: Effort normal. Right breast exhibits no inverted nipple, no mass, no nipple discharge, no skin change and no tenderness. Left breast exhibits mass (soft mobile lesion between 5 and 6 o'clock LEFT breast, about size of lima bean). Left breast exhibits no inverted nipple, no nipple discharge, no skin change and no tenderness. Breasts are symmetrical.  Abdominal: She exhibits no distension.  Skin: No rash noted. No pallor.  Psychiatric: She has a normal mood and affect. Her behavior is normal. Judgment and thought content normal. Her mood appears not anxious. She does not exhibit a depressed mood.   Results for orders placed or performed in visit on 07/15/16  Urine Culture  Result Value Ref Range   Colony Count 50,000-100,000 CFU/mL    Organism ID, Bacteria Three or more organisms present,each greater than  Organism ID, Bacteria 10,000 CFU/mL.These organisms,commonly found on    Organism ID, Bacteria external and internal genitalia,are considered to    Organism ID, Bacteria be colonizers.No further testing performed.   ANA,IFA RA Diag Pnl w/rflx Tit/Patn  Result Value Ref Range   Rhuematoid fact SerPl-aCnc <10 <=14 IU/mL   Anit Nuclear Antibody(ANA) POS (A) NEGATIVE   Cyclic Citrullin Peptide Ab <16 Units  C-reactive protein  Result Value Ref Range   CRP <0.5 <0.60 mg/dL  CBC with Differential/Platelet  Result Value Ref Range   WBC 5.3 3.8 - 10.8 K/uL   RBC 4.12 3.80 - 5.10 MIL/uL   Hemoglobin 11.9 11.7 - 15.5 g/dL   HCT 36.9 35.0 - 45.0 %   MCV 89.6 80.0 - 100.0 fL   MCH 28.9 27.0 - 33.0 pg   MCHC 32.2 32.0 - 36.0 g/dL   RDW 14.3 11.0 -  15.0 %   Platelets 338 140 - 400 K/uL   MPV 10.9 7.5 - 12.5 fL   Neutro Abs 2,915 1,500 - 7,800 cells/uL   Lymphs Abs 1,961 850 - 3,900 cells/uL   Monocytes Absolute 318 200 - 950 cells/uL   Eosinophils Absolute 53 15 - 500 cells/uL   Basophils Absolute 53 0 - 200 cells/uL   Neutrophils Relative % 55 %   Lymphocytes Relative 37 %   Monocytes Relative 6 %   Eosinophils Relative 1 %   Basophils Relative 1 %   Smear Review Criteria for review not met   Comprehensive metabolic panel  Result Value Ref Range   Sodium 137 135 - 146 mmol/L   Potassium 4.2 3.5 - 5.3 mmol/L   Chloride 102 98 - 110 mmol/L   CO2 26 20 - 31 mmol/L   Glucose, Bld 75 65 - 99 mg/dL   BUN 10 7 - 25 mg/dL   Creat 0.85 0.50 - 1.10 mg/dL   Total Bilirubin 0.4 0.2 - 1.2 mg/dL   Alkaline Phosphatase 59 33 - 115 U/L   AST 26 10 - 30 U/L   ALT 22 6 - 29 U/L   Total Protein 7.5 6.1 - 8.1 g/dL   Albumin 4.3 3.6 - 5.1 g/dL   Calcium 9.3 8.6 - 10.2 mg/dL  VITAMIN D 25 Hydroxy (Vit-D Deficiency, Fractures)  Result Value Ref Range   Vit D, 25-Hydroxy 29 (L) 30 - 100 ng/mL  Vitamin B12  Result Value Ref Range   Vitamin B-12 633 200 - 1,100 pg/mL  TSH  Result Value Ref Range   TSH 1.07 mIU/L  Anti-DNA antibody, double-stranded  Result Value Ref Range   ds DNA Ab 4 IU/mL  Lyme Ab/Western Blot Reflex  Result Value Ref Range   B burgdorferi Ab IgG+IgM <0.90 <0.90 Index  Ribosomal P Protein Ab  Result Value Ref Range   Ribosomal P Protein Ab <1.0 NEG <1.0 NEG AI  Centromere Antibodies  Result Value Ref Range   Centromere Ab Screen <1.0 NEG <1.0 NEG AI  Anti-scleroderma antibody  Result Value Ref Range   Scleroderma (Scl-70) (ENA) Antibody, IgG <1.0 NEG <1.0 NEG AI  Jo-1 antibody-IgG  Result Value Ref Range   Jo-1 Antibody, IgG <1.0 NEG <1.0 NEG AI  Sjogrens syndrome-A extractable nuclear antibody  Result Value Ref Range   SSA (Ro) (ENA) Antibody, IgG <1.0 NEG <1.0 NEG AI  Sjogrens syndrome-B extractable  nuclear antibody  Result Value Ref Range   SSB (La) (ENA) Antibody, IgG <1.0 NEG <1.0 NEG AI  Anti-Smith antibody  Result  Value Ref Range   ENA SM Ab Ser-aCnc <1.0 NEG <1.0 NEG AI  Anti-ribonucleic acid antibody  Result Value Ref Range   SM/RNP <1.0 NEG <1.0 NEG AI  Anti-nuclear ab-titer (ANA titer)  Result Value Ref Range   ANA Pattern 1 HOMOGENEOUS (A)    ANA Titer 1 1:40 (H) titer  POCT urine pregnancy  Result Value Ref Range   Preg Test, Ur Negative Negative  POCT urinalysis dipstick  Result Value Ref Range   Color, UA light yellow    Clarity, UA clear    Glucose, UA neg    Bilirubin, UA neg    Ketones, UA neg    Spec Grav, UA 1.020    Blood, UA neg    pH, UA 5.5    Protein, UA neg    Urobilinogen, UA 0.2    Nitrite, UA neg    Leukocytes, UA moderate (2+) (A) Negative      Assessment & Plan:   Problem List Items Addressed This Visit      Other   Urinary frequency    Refer to urologist for urinary frequency going on for years; limit caffeine or eliminate completely      Positive ANA (antinuclear antibody)    Discussed that this is a very equivocal/low test finding, and rheumatology may not find this significant; however, with her nail findings and other symptoms, I do agree with having her evaluated by rheumatologist      Chronic pelvic pain in female    With urinary frequency; will have her see urologist to see if urologic etiology for her discomfort      Breast tenderness in female    Check [place]; try evening primrose oil      Relevant Orders   US BREAST COMPLETE UNI LEFT INC AXILLA   US BREAST COMPLETE UNI RIGHT INC AXILLA   Breast lump on left side at 5 o'clock position    Order [place]; explained that I am glad she told me about this; present for 3 months, need to get it checked out; could be cystic lesion, fibroadenoma, but still need to check out any lump at all in the breast      Relevant Orders   US BREAST COMPLETE UNI LEFT INC AXILLA   US BREAST  COMPLETE UNI RIGHT INC AXILLA    Other Visit Diagnoses   None.      Follow up plan: Return if symptoms worsen or fail to improve.  An after-visit summary was printed and given to the patient at [place].  Please see the patient instructions which may contain other information and recommendations beyond what is mentioned above in the assessment and plan.  No orders of the defined types were placed in this encounter.   Orders Placed This Encounter  Procedures  . US BREAST COMPLETE UNI LEFT INC AXILLA  . US BREAST COMPLETE UNI RIGHT INC AXILLA

## 2016-08-02 NOTE — Assessment & Plan Note (Addendum)
Order [place]; explained that I am glad she told me about this; present for 3 months, need to get it checked out; could be cystic lesion, fibroadenoma, but still need to check out any lump at all in the breast

## 2016-08-02 NOTE — Assessment & Plan Note (Signed)
Refer to urologist for urinary frequency going on for years; limit caffeine or eliminate completely

## 2016-08-02 NOTE — Assessment & Plan Note (Signed)
Check [place]; try evening primrose oil

## 2016-08-03 NOTE — Assessment & Plan Note (Signed)
Discussed that this is a very equivocal/low test finding, and rheumatology may not find this significant; however, with her nail findings and other symptoms, I do agree with having her evaluated by rheumatologist

## 2016-08-03 NOTE — Assessment & Plan Note (Signed)
With urinary frequency; will have her see urologist to see if urologic etiology for her discomfort

## 2016-08-18 ENCOUNTER — Ambulatory Visit
Admission: RE | Admit: 2016-08-18 | Discharge: 2016-08-18 | Disposition: A | Discharge status: Home / Self Care | Payer: 59 | Source: Ambulatory Visit | Attending: Family Medicine | Admitting: Family Medicine

## 2016-08-18 ENCOUNTER — Ambulatory Visit: Admission: RE | Admit: 2016-08-18 | Payer: 59 | Source: Ambulatory Visit

## 2016-08-18 ENCOUNTER — Other Ambulatory Visit: Payer: Self-pay

## 2016-08-18 ENCOUNTER — Other Ambulatory Visit: Payer: Self-pay | Admitting: Family Medicine

## 2016-08-18 DIAGNOSIS — N63 Unspecified lump in breast: Secondary | ICD-10-CM | POA: Diagnosis not present

## 2016-08-18 DIAGNOSIS — N6320 Unspecified lump in the left breast, unspecified quadrant: Secondary | ICD-10-CM

## 2017-01-19 ENCOUNTER — Telehealth: Payer: Self-pay | Admitting: Family Medicine

## 2017-01-19 DIAGNOSIS — N6323 Unspecified lump in the left breast, lower outer quadrant: Secondary | ICD-10-CM

## 2017-01-19 NOTE — Telephone Encounter (Signed)
Copied from previous report: Recommend follow-up left breast diagnostic mammogram and ultrasound in 6 months to ensure stability. ------------------------------- Diagnostic mammo and [place] ordered by Dr. Sanda Klein; expected date 02/18/17  Orders Placed This Encounter  Procedures  . MM Digital Diagnostic Unilat L    Standing Status:   Future    Standing Expiration Date:   05/18/2017    Order Specific Question:   Reason for Exam (SYMPTOM  OR DIAGNOSIS REQUIRED)    Answer:   abnormal mammogram and [place] in August 2017; due for 6 month f/u    Order Specific Question:   Is the patient pregnant?    Answer:   No    Comments:   you will have to ask her    Order Specific Question:   Preferred imaging location?    Answer:   [facility]  . US BREAST LTD UNI LEFT INC AXILLA    Standing Status:   Future    Standing Expiration Date:   05/18/2017    Order Specific Question:   Reason for Exam (SYMPTOM  OR DIAGNOSIS REQUIRED)    Answer:   abnormal mammo and [place] LEFT breast; due for 6 month f/u    Order Specific Question:   Preferred imaging location?    Answer:   [facility]

## 2017-01-19 NOTE — Assessment & Plan Note (Signed)
Due for 6 month imaging in February

## 2017-01-19 NOTE — Telephone Encounter (Signed)
-----   Message from Arnetha Courser, MD sent at 08/19/2016  4:08 PM EDT ----- Regarding: 6 month breast imaging due in Feb LEFT breast imaging due in FEB 2018

## 2017-04-08 DIAGNOSIS — N390 Urinary tract infection, site not specified: Secondary | ICD-10-CM | POA: Diagnosis not present

## 2017-04-08 DIAGNOSIS — R1084 Generalized abdominal pain: Secondary | ICD-10-CM | POA: Diagnosis not present

## 2017-04-21 ENCOUNTER — Other Ambulatory Visit: Payer: Self-pay | Admitting: Family Medicine

## 2017-04-21 DIAGNOSIS — R109 Unspecified abdominal pain: Secondary | ICD-10-CM

## 2017-04-21 DIAGNOSIS — R52 Pain, unspecified: Secondary | ICD-10-CM

## 2017-04-27 ENCOUNTER — Ambulatory Visit
Admission: RE | Admit: 2017-04-27 | Discharge: 2017-04-27 | Disposition: A | Discharge status: Home / Self Care | Payer: 59 | Source: Ambulatory Visit | Attending: Family Medicine | Admitting: Family Medicine

## 2017-04-27 DIAGNOSIS — R52 Pain, unspecified: Secondary | ICD-10-CM

## 2017-04-27 DIAGNOSIS — R109 Unspecified abdominal pain: Secondary | ICD-10-CM | POA: Diagnosis not present

## 2017-04-27 DIAGNOSIS — R10813 Right lower quadrant abdominal tenderness: Secondary | ICD-10-CM | POA: Diagnosis not present

## 2017-05-02 DIAGNOSIS — R1084 Generalized abdominal pain: Secondary | ICD-10-CM | POA: Diagnosis not present

## 2017-05-16 DIAGNOSIS — R1084 Generalized abdominal pain: Secondary | ICD-10-CM | POA: Diagnosis not present

## 2017-05-16 DIAGNOSIS — R1032 Left lower quadrant pain: Secondary | ICD-10-CM | POA: Diagnosis not present

## 2017-05-16 DIAGNOSIS — R1031 Right lower quadrant pain: Secondary | ICD-10-CM | POA: Diagnosis not present

## 2017-05-18 ENCOUNTER — Other Ambulatory Visit: Payer: Self-pay

## 2017-05-18 DIAGNOSIS — N6323 Unspecified lump in the left breast, lower outer quadrant: Secondary | ICD-10-CM

## 2017-05-18 DIAGNOSIS — N644 Mastodynia: Secondary | ICD-10-CM

## 2017-08-24 ENCOUNTER — Other Ambulatory Visit: Payer: Self-pay

## 2017-08-24 DIAGNOSIS — N6323 Unspecified lump in the left breast, lower outer quadrant: Secondary | ICD-10-CM

## 2017-08-24 DIAGNOSIS — N644 Mastodynia: Secondary | ICD-10-CM

## 2017-08-24 NOTE — Progress Notes (Addendum)
I called [place] and spoke with Melissa to get this patient scheduled but since she did not have her 6 month f/u for a repeat scan, she now has to have the full imaging performed.  New orders has been placed.   Patient has been scheduled to have this performed on Thursday, September 22, 2017 @ 2:00pm.   Patient would like to be called after 3:30pm.  She can call [facility] at [phone], if that date and time is not good.  Patient was informed of the this appointment via voicemail.

## 2017-09-22 ENCOUNTER — Other Ambulatory Visit: Payer: 59

## 2017-12-13 ENCOUNTER — Ambulatory Visit: Payer: 59 | Admitting: Family Medicine

## 2017-12-13 ENCOUNTER — Encounter: Payer: Self-pay | Admitting: Family Medicine

## 2017-12-13 VITALS — BP 116/66 | HR 81 | Temp 98.5°F | Resp 14 | Wt 159.2 lb

## 2017-12-13 DIAGNOSIS — K219 Gastro-esophageal reflux disease without esophagitis: Secondary | ICD-10-CM | POA: Diagnosis not present

## 2017-12-13 DIAGNOSIS — R110 Nausea: Secondary | ICD-10-CM | POA: Diagnosis not present

## 2017-12-13 DIAGNOSIS — N926 Irregular menstruation, unspecified: Secondary | ICD-10-CM | POA: Diagnosis not present

## 2017-12-13 DIAGNOSIS — R1013 Epigastric pain: Secondary | ICD-10-CM | POA: Diagnosis not present

## 2017-12-13 DIAGNOSIS — N6323 Unspecified lump in the left breast, lower outer quadrant: Secondary | ICD-10-CM

## 2017-12-13 LAB — POCT URINE PREGNANCY: PREG TEST UR: NEGATIVE

## 2017-12-13 MED ORDER — OMEPRAZOLE 20 MG PO CPDR: 20.0000 mg | DELAYED_RELEASE_CAPSULE | Freq: Every day | ORAL | 1 refills | Status: DC

## 2017-12-13 NOTE — Assessment & Plan Note (Addendum)
Order mammo and [place]; reviewed last study with patient, urged her to f/u on this
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
## 2017-12-13 NOTE — Patient Instructions (Addendum)
We'll get labs today and contact you Try to limit or avoid triggers like coffee, caffeinated beverages, onions, chocolate, spicy foods, peppermint, acidic foods like pizza, spaghetti sauce, and orange juice Lose weight if you are overweight or obese Try elevating the head of your bed by placing a small wedge between your mattress and box springs to keep acid in the stomach at night instead of coming up into your esophagus We'll get the breast imaging at the end of the week

## 2017-12-14 LAB — CBC WITH DIFFERENTIAL/PLATELET
Basophils Absolute: 50 {cells}/uL (ref 0–200)
Basophils Relative: 0.9 %
Eosinophils Absolute: 73 {cells}/uL (ref 15–500)
Eosinophils Relative: 1.3 %
HCT: 36.1 % (ref 35.0–45.0)
Hemoglobin: 12 g/dL (ref 11.7–15.5)
Lymphs Abs: 2324 {cells}/uL (ref 850–3900)
MCH: 29.9 pg (ref 27.0–33.0)
MCHC: 33.2 g/dL (ref 32.0–36.0)
MCV: 89.8 fL (ref 80.0–100.0)
MPV: 10.8 fL (ref 7.5–12.5)
Monocytes Relative: 7.7 %
Neutro Abs: 2722 {cells}/uL (ref 1500–7800)
Neutrophils Relative %: 48.6 %
Platelets: 303 Thousand/uL (ref 140–400)
RBC: 4.02 Million/uL (ref 3.80–5.10)
RDW: 11.8 % (ref 11.0–15.0)
Total Lymphocyte: 41.5 %
WBC mixed population: 431 {cells}/uL (ref 200–950)
WBC: 5.6 Thousand/uL (ref 3.8–10.8)

## 2017-12-14 LAB — COMPLETE METABOLIC PANEL WITH GFR
AG RATIO: 1.4 (calc) (ref 1.0–2.5)
ALKALINE PHOSPHATASE (APISO): 55 U/L (ref 33–115)
ALT: 13 U/L (ref 6–29)
AST: 17 U/L (ref 10–30)
Albumin: 4.1 g/dL (ref 3.6–5.1)
BILIRUBIN TOTAL: 0.3 mg/dL (ref 0.2–1.2)
BUN: 11 mg/dL (ref 7–25)
CO2: 28 mmol/L (ref 20–32)
Calcium: 9.4 mg/dL (ref 8.6–10.2)
Chloride: 105 mmol/L (ref 98–110)
Creat: 0.91 mg/dL (ref 0.50–1.10)
GFR, Est African American: 97 mL/min/{1.73_m2} (ref >=60)
GFR, Est Non African American: 84 mL/min/{1.73_m2} (ref >=60)
GLOBULIN: 2.9 g/dL (ref 1.9–3.7)
Glucose, Bld: 85 mg/dL (ref 65–99)
POTASSIUM: 4.7 mmol/L (ref 3.5–5.3)
SODIUM: 138 mmol/L (ref 135–146)
Total Protein: 7 g/dL (ref 6.1–8.1)

## 2017-12-14 LAB — TSH: TSH: 0.71 mIU/L

## 2017-12-14 LAB — HCG, SERUM, QUALITATIVE: Preg, Serum: NEGATIVE

## 2017-12-14 LAB — T3, FREE: T3 FREE: 2.8 pg/mL (ref 2.3–4.2)

## 2017-12-14 LAB — T4, FREE: FREE T4: 1.3 ng/dL (ref 0.8–1.8)

## 2017-12-14 LAB — H. PYLORI BREATH TEST: H. pylori Breath Test: NOT DETECTED

## 2017-12-14 NOTE — Assessment & Plan Note (Signed)
Checked urine hCG; will get serum hCG and check thyroid function; patient to f/u with her OB-GYN

## 2018-01-26 DIAGNOSIS — N925 Other specified irregular menstruation: Secondary | ICD-10-CM | POA: Diagnosis not present

## 2018-01-26 DIAGNOSIS — Z3169 Encounter for other general counseling and advice on procreation: Secondary | ICD-10-CM | POA: Diagnosis not present

## 2018-02-24 DIAGNOSIS — N970 Female infertility associated with anovulation: Secondary | ICD-10-CM | POA: Diagnosis not present

## 2018-02-24 DIAGNOSIS — Z3149 Encounter for other procreative investigation and testing: Secondary | ICD-10-CM | POA: Diagnosis not present

## 2018-04-04 DIAGNOSIS — Z3169 Encounter for other general counseling and advice on procreation: Secondary | ICD-10-CM | POA: Diagnosis not present

## 2018-04-04 DIAGNOSIS — Z3149 Encounter for other procreative investigation and testing: Secondary | ICD-10-CM | POA: Diagnosis not present

## 2018-04-04 DIAGNOSIS — N970 Female infertility associated with anovulation: Secondary | ICD-10-CM | POA: Diagnosis not present

## 2018-04-20 DIAGNOSIS — Z3149 Encounter for other procreative investigation and testing: Secondary | ICD-10-CM | POA: Diagnosis not present

## 2018-05-24 DIAGNOSIS — Z3149 Encounter for other procreative investigation and testing: Secondary | ICD-10-CM | POA: Diagnosis not present

## 2018-06-20 ENCOUNTER — Other Ambulatory Visit: Payer: Self-pay | Admitting: Obstetrics and Gynecology

## 2018-06-20 DIAGNOSIS — Z3149 Encounter for other procreative investigation and testing: Secondary | ICD-10-CM

## 2018-06-23 ENCOUNTER — Encounter: Payer: Self-pay | Admitting: Family Medicine

## 2018-06-25 DIAGNOSIS — J0100 Acute maxillary sinusitis, unspecified: Secondary | ICD-10-CM | POA: Diagnosis not present

## 2018-07-13 ENCOUNTER — Ambulatory Visit
Admission: RE | Admit: 2018-07-13 | Discharge: 2018-07-13 | Disposition: A | Discharge status: Home / Self Care | Payer: 59 | Source: Ambulatory Visit | Attending: Obstetrics and Gynecology | Admitting: Obstetrics and Gynecology

## 2018-07-13 ENCOUNTER — Other Ambulatory Visit: Payer: Self-pay | Admitting: Obstetrics and Gynecology

## 2018-07-13 ENCOUNTER — Encounter: Payer: Self-pay | Admitting: Obstetrics and Gynecology

## 2018-07-13 DIAGNOSIS — Z3149 Encounter for other procreative investigation and testing: Secondary | ICD-10-CM | POA: Diagnosis not present

## 2018-07-13 LAB — PREGNANCY, URINE: PREG TEST UR: NEGATIVE

## 2018-07-13 MED ORDER — IOPAMIDOL (ISOVUE-370) INJECTION 76%
20.0000 mL | Freq: Once | INTRAVENOUS | Status: AC | PRN
  Administered 2018-07-13: 20 mL

## 2018-07-13 NOTE — Progress Notes (Unsigned)
@  PATIENTID@[id number]   1987/04/32 y.o. 07/13/18 Benjaman Kindler, MD  Hysterosalpingogram Procedure Note  Date of procedure: 07/13/2018   Pre-operative Diagnosis: Infertility  Post-operative Diagnosis: same, left tube patent, right tube not visualized.  Procedure: Hysterosalpingogram  Surgeon: Angelina Pih, MD  Assistant(s):  Radiology assistant. The radiologist present for today read the imaging and agreed with findings below.  Anesthesia: None  Estimated Blood Loss:  None         Complications:  None; patient tolerated the procedure well.         Disposition: To home         Condition: stable  Findings: Left-sided fill and spill of the tube on left and no spill on the right. No hydrosalpinx noted.  Normal endometrial contour was noted.  Procedure Details  HSG procedure discussed with the patient.  Risks, complications, alternatives have been reviewed with her and she agrees to proceed.   The patient presented to the radiology lab and was identified as the correct patient and the procedure verified as an HSG. A verbal Time Out was held with all team members present and in agreement.  Speculum was inserted in to the vagina and the cervix visualized.  The cervix was cleaned with betadine solution. The HSG catheter was inserted and the balloon insufflated with approximately 1.5 ml of air.  Patient was then repositioned for fluoroscopy.  A total of 15 ml of contrast was used for the procedure. The patient tolerated the procedure well, no complications.    Results were reviewed with the patient at the time of the procedure. She verbalized understanding.   Benjaman Kindler, MD 07/13/2018

## 2018-07-13 NOTE — Progress Notes (Signed)
HSG today, orders placed

## 2018-08-08 ENCOUNTER — Ambulatory Visit: Payer: 59 | Admitting: Family Medicine

## 2018-08-08 ENCOUNTER — Encounter: Payer: Self-pay | Admitting: Family Medicine

## 2018-08-08 VITALS — BP 102/64 | HR 78 | Temp 98.4°F | Resp 12 | Ht 67.5 in | Wt 162.7 lb

## 2018-08-08 DIAGNOSIS — K921 Melena: Secondary | ICD-10-CM | POA: Diagnosis not present

## 2018-08-08 DIAGNOSIS — R195 Other fecal abnormalities: Secondary | ICD-10-CM | POA: Diagnosis not present

## 2018-08-08 DIAGNOSIS — M797 Fibromyalgia: Secondary | ICD-10-CM

## 2018-08-08 DIAGNOSIS — R1013 Epigastric pain: Secondary | ICD-10-CM

## 2018-08-08 MED ORDER — PRENATAL 27-0.8 MG PO TABS: 1.0000 | ORAL_TABLET | Freq: Every day | ORAL | 0 refills | Status: DC

## 2018-08-08 NOTE — Assessment & Plan Note (Signed)
Triggered by cold environments; I agree with patient being able to control her ambient temperature while working; letter written

## 2018-08-08 NOTE — Patient Instructions (Addendum)
Try turmeric as a natural anti-inflammatory (for pain and arthritis). It comes in capsules where you buy aspirin and fish oil, but also as a spice where you buy pepper and garlic powder. We'll have you see the gastroenterologist
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
## 2018-09-22 ENCOUNTER — Telehealth: Payer: 59 | Admitting: Family

## 2018-09-22 DIAGNOSIS — N390 Urinary tract infection, site not specified: Secondary | ICD-10-CM

## 2018-09-22 MED ORDER — CEPHALEXIN 500 MG PO CAPS: 500.0000 mg | ORAL_CAPSULE | Freq: Two times a day (BID) | ORAL | 0 refills | Status: DC

## 2018-09-22 NOTE — Progress Notes (Signed)

## 2018-09-25 ENCOUNTER — Ambulatory Visit: Payer: 59 | Admitting: Gastroenterology

## 2018-10-05 ENCOUNTER — Encounter: Payer: Self-pay | Admitting: Gastroenterology

## 2018-10-05 ENCOUNTER — Ambulatory Visit (INDEPENDENT_AMBULATORY_CARE_PROVIDER_SITE_OTHER): Payer: 59 | Admitting: Gastroenterology

## 2018-10-05 VITALS — BP 102/75 | HR 88 | Resp 17 | Ht 67.5 in | Wt 163.4 lb

## 2018-10-05 DIAGNOSIS — R1084 Generalized abdominal pain: Secondary | ICD-10-CM | POA: Diagnosis not present

## 2018-10-05 DIAGNOSIS — K5909 Other constipation: Secondary | ICD-10-CM

## 2018-10-05 MED ORDER — AMITRIPTYLINE HCL 25 MG PO TABS: 25.0000 mg | ORAL_TABLET | Freq: Every day | ORAL | 0 refills | Status: DC

## 2018-10-05 NOTE — Progress Notes (Signed)
Cephas Darby, MD [address]  [place]  [place], [place] [address]  Main: [phone]  Fax: [phone]    Gastroenterology Consultation  Referring Provider:     Arnetha Courser, MD Primary Care Physician:  Arnetha Courser, MD Primary Gastroenterologist:  Dr. Cephas Darby Reason for Consultation:     Blood and mucus in stool, chronic constipation, abdominal pain        HPI:   Beth Cox is a 32 y.o. female referred by Dr. Arnetha Courser, MD  for consultation & management of symptoms described as below, ongoing for more than a year Chronic abdominal pain, cramping, squeezing type of pain, more or less generalized.  Also, noticing mucus and intermittent specks of blood in stool Tend to be constipated, Incorporated more fiber in her diet which helped to go regularly but sometimes her stools are hard Generally stressful, does notice correlation with worsening of her GI symptoms with stress Denies weight loss, no LOA Patient did not identify any particular food triggers H. pylori breath test was negative, routine labs unremarkable She had an ultrasound abdomen in 2018 unremarkable She does exercise regularly No Tob or ETOH use Patient has fibromyalgia and currently not on any medication She does full-time job as a Futures trader, and part-time photography She and her husband have been trying to have kids in last 3 years NSAIDs: none  Antiplts/Anticoagulants/Anti thrombotics: none  GI Procedures: None She denies family history of GI malignancy She denies family history of inflammatory bowel disease  Past Medical History:  Diagnosis Date  . Allergy   . Anxiety, mild 06/04/2015  . Fibromyalgia syndrome 09/23/2015  . Migraine     No past surgical history on file.   Current Outpatient Medications:  .  amitriptyline (ELAVIL) 25 MG tablet, Take 1 tablet (25 mg total) by mouth at bedtime., Disp: 30 tablet, Rfl: 0 .  cephALEXin (KEFLEX) 500 MG  capsule, Take 1 capsule (500 mg total) by mouth 2 (two) times daily. (Patient not taking: Reported on 10/05/2018), Disp: 14 capsule, Rfl: 0 .  Prenatal Vit-Fe Fumarate-FA (MULTIVITAMIN-PRENATAL) 27-0.8 MG TABS tablet, Take 1 tablet by mouth daily at 12 noon. (Patient not taking: Reported on 10/05/2018), Disp: 30 each, Rfl: 0  Family History  Problem Relation Age of Onset  . Depression Mother   . Depression Father   . Breast cancer Neg Hx      Social History   Tobacco Use  . Smoking status: Never Smoker  . Smokeless tobacco: Never Used  Substance Use Topics  . Alcohol use: No    Alcohol/week: 0.0 standard drinks  . Drug use: No    Allergies as of 10/05/2018 - Review Complete 10/05/2018  Allergen Reaction Noted  . Shellfish allergy Other (See Comments) 06/04/2015  . Apple  07/25/2015  . Peanuts  [peanut oil]  07/25/2015  . Shellfish-derived products  07/25/2015    Review of Systems:    All systems reviewed and negative except where noted in HPI.   Physical Exam:  BP 102/75 (BP Location: Left Arm, Patient Position: Sitting, Cuff Size: Large)   Pulse 88   Resp 17   Ht 5' 7.5" (1.715 m)   Wt 163 lb 6.4 oz (74.1 kg)   BMI 25.21 kg/m  No LMP recorded.  General:   Alert,  Well-developed, well-nourished, pleasant and cooperative in NAD Head:  Normocephalic and atraumatic. Eyes:  Sclera clear, no icterus.   Conjunctiva pink. Ears:  Normal auditory acuity. Nose:  No deformity, discharge, or lesions. Mouth:  No deformity or lesions,oropharynx pink & moist. Neck:  Supple; no masses or thyromegaly. Lungs:  Respirations even and unlabored.  Clear throughout to auscultation.   No wheezes, crackles, or rhonchi. No acute distress. Heart:  Regular rate and rhythm; no murmurs, clicks, rubs, or gallops. Abdomen:  Normal bowel sounds. Soft, non-tender and non-distended without masses, hepatosplenomegaly or hernias noted.  No guarding or rebound tenderness.   Rectal: Not performed Msk:   Symmetrical without gross deformities. Good, equal movement & strength bilaterally. Pulses:  Normal pulses noted. Extremities:  No clubbing or edema.  No cyanosis. Neurologic:  Alert and oriented x3;  grossly normal neurologically. Skin:  Intact without significant lesions or rashes. No jaundice. Lymph Nodes:  No significant cervical adenopathy. Psych:  Alert and cooperative. Normal mood and affect.  Imaging Studies: Reviewed  Assessment and Plan:   Beth Cox is a 32 y.o. African-American female with history of fibromyalgia, presents with chronic symptoms of generalized abdominal pain, trace amount of blood and mucus in stool, chronic constipation.  Her symptoms are mostly functional associated with stress and fibromyalgia.  I am not worried about recent months of blood in her stool which are only intermittent.  Counseled her about management of stress and fibromyalgia and the association with functional GI disorders.  Discussed about different management options including stress stabilization techniques, trial of tricyclic antidepressants.  Patient is willing to try amitriptyline.  Discussed with her about management of constipation as well.  Reassured her that her symptoms are very likely functional If she continues to have blood in stool, will perform flexible sigmoidoscopy and patient is agreeable  Follow up in 1 month   Cephas Darby, MD

## 2018-10-05 NOTE — Patient Instructions (Signed)
High-Fiber Diet  Fiber, also called dietary fiber, is a type of carbohydrate found in fruits, vegetables, whole grains, and beans. A high-fiber diet can have many health benefits. Your health care provider may recommend a high-fiber diet to help:  · Prevent constipation. Fiber can make your bowel movements more regular.  · Lower your cholesterol.  · Relieve hemorrhoids, uncomplicated diverticulosis, or irritable bowel syndrome.  · Prevent overeating as part of a weight-loss plan.  · Prevent heart disease, type 2 diabetes, and certain cancers.    What is my plan?  The recommended daily intake of fiber includes:  · 38 grams for men under age 50.  · 30 grams for men over age 50.  · 25 grams for women under age 50.  · 21 grams for women over age 50.    You can get the recommended daily intake of dietary fiber by eating a variety of fruits, vegetables, grains, and beans. Your health care provider may also recommend a fiber supplement if it is not possible to get enough fiber through your diet.  What do I need to know about a high-fiber diet?  · Fiber supplements have not been widely studied for their effectiveness, so it is better to get fiber through food sources.  · Always check the fiber content on the nutrition facts label of any prepackaged food. Look for foods that contain at least 5 grams of fiber per serving.  · Ask your dietitian if you have questions about specific foods that are related to your condition, especially if those foods are not listed in the following section.  · Increase your daily fiber consumption gradually. Increasing your intake of dietary fiber too quickly may cause bloating, cramping, or gas.  · Drink plenty of water. Water helps you to digest fiber.  What foods can I eat?  Grains  Whole-grain breads. Multigrain cereal. Oats and oatmeal. Brown rice. Barley. Bulgur wheat. Millet. Bran muffins. Popcorn. Rye wafer crackers.  Vegetables   Sweet potatoes. Spinach. Kale. Artichokes. Cabbage. Broccoli. Green peas. Carrots. Squash.  Fruits  Berries. Pears. Apples. Oranges. Avocados. Prunes and raisins. Dried figs.  Meats and Other Protein Sources  Navy, kidney, pinto, and soy beans. Split peas. Lentils. Nuts and seeds.  Dairy  Fiber-fortified yogurt.  Beverages  Fiber-fortified soy milk. Fiber-fortified orange juice.  Other  Fiber bars.  The items listed above may not be a complete list of recommended foods or beverages. Contact your dietitian for more options.  What foods are not recommended?  Grains  White bread. Pasta made with refined flour. White rice.  Vegetables  Fried potatoes. Canned vegetables. Well-cooked vegetables.  Fruits  Fruit juice. Cooked, strained fruit.  Meats and Other Protein Sources  Fatty cuts of meat. Fried poultry or fried fish.  Dairy  Milk. Yogurt. Cream cheese. Sour cream.  Beverages  Soft drinks.  Other  Cakes and pastries. Butter and oils.  The items listed above may not be a complete list of foods and beverages to avoid. Contact your dietitian for more information.  What are some tips for including high-fiber foods in my diet?  · Eat a wide variety of high-fiber foods.  · Make sure that half of all grains consumed each day are whole grains.  · Replace breads and cereals made from refined flour or white flour with whole-grain breads and cereals.  · Replace white rice with brown rice, bulgur wheat, or millet.  · Start the day with a breakfast that is high in fiber,   such as a cereal that contains at least 5 grams of fiber per serving.  · Use beans in place of meat in soups, salads, or pasta.  · Eat high-fiber snacks, such as berries, raw vegetables, nuts, or popcorn.  This information is not intended to replace advice given to you by your health care provider. Make sure you discuss any questions you have with your health care provider.  Document Released: 12/13/2005 Document Revised: 05/20/2016 Document Reviewed: 05/28/2014   Elsevier Interactive Patient Education © 2018 [employer]

## 2018-11-07 ENCOUNTER — Ambulatory Visit: Payer: 59 | Admitting: Gastroenterology

## 2018-11-13 DIAGNOSIS — Z3201 Encounter for pregnancy test, result positive: Secondary | ICD-10-CM | POA: Diagnosis not present

## 2018-12-11 ENCOUNTER — Telehealth: Payer: Self-pay | Admitting: Family Medicine

## 2018-12-11 DIAGNOSIS — N6320 Unspecified lump in the left breast, unspecified quadrant: Secondary | ICD-10-CM

## 2018-12-11 NOTE — Telephone Encounter (Signed)
Called patient to make sure she gets additional orders, she states is going to hold off for now due to being pregnant.  I told her the importance of this and be sure to discuss with OB gyn

## 2018-12-11 NOTE — Telephone Encounter (Signed)
I am going through old / expired orders Please contact patient and ask her to get follow-up studies done ASAP, order whatever [facility] thinks is approprate now that she is so far out; i'm going to think that they will want bilateral diag plus bilateral US  -----------------------------------------------------------  From previous report: RECOMMENDATION: Follow-up LEFT breast diagnostic mammogram and ultrasound in 6 months.  I have discussed the findings and recommendations with the patient. Results were also provided in writing at the conclusion of the visit. If applicable, a reminder letter will be sent to the patient regarding the next appointment.  BI-RADS CATEGORY  3: Probably benign.   Electronically Signed   By: Franki Cabot M.D.   On: 08/18/2016 10:30

## 2018-12-11 NOTE — Telephone Encounter (Signed)
At the very least, we will urge her to get the ultrasounds done which have no radiation; please call her back Also, please contact her OB-GYN and make sure that doctor is aware of the need for follow-up breast imaging so they can coordinate or do other testing appropriate during her pregnancy Also, update med list, pregnancy status, and close pap smear care gap (if she has had pap smear with GYN, we need that for the chart please) Thank you

## 2018-12-12 NOTE — Telephone Encounter (Signed)
Pt notified, she states will discuss with obgyn first before having any imaging.  I also called [facility] ob-gyn and left message for her provider to follow-up on this with patient. Last pap was 2014 and added to chart.

## 2018-12-22 DIAGNOSIS — R05 Cough: Secondary | ICD-10-CM | POA: Diagnosis not present

## 2018-12-22 DIAGNOSIS — Z8719 Personal history of other diseases of the digestive system: Secondary | ICD-10-CM | POA: Diagnosis not present

## 2018-12-27 LAB — HM PAP SMEAR: HM Pap smear: NORMAL

## 2019-01-05 DIAGNOSIS — Z3401 Encounter for supervision of normal first pregnancy, first trimester: Secondary | ICD-10-CM | POA: Diagnosis not present

## 2019-01-05 LAB — OB RESULTS CONSOLE RUBELLA ANTIBODY, IGM: Rubella: IMMUNE

## 2019-01-05 LAB — OB RESULTS CONSOLE HEPATITIS B SURFACE ANTIGEN: Hepatitis B Surface Ag: NEGATIVE

## 2019-01-05 LAB — HM PAP SMEAR: HM Pap smear: NEGATIVE

## 2019-01-05 LAB — OB RESULTS CONSOLE VARICELLA ZOSTER ANTIBODY, IGG: Varicella: IMMUNE

## 2019-01-05 LAB — RESULTS CONSOLE HPV: CHL HPV: NEGATIVE

## 2019-01-08 DIAGNOSIS — O3680X0 Pregnancy with inconclusive fetal viability, not applicable or unspecified: Secondary | ICD-10-CM | POA: Diagnosis not present

## 2019-02-19 DIAGNOSIS — Z3402 Encounter for supervision of normal first pregnancy, second trimester: Secondary | ICD-10-CM | POA: Diagnosis not present

## 2019-03-19 DIAGNOSIS — L299 Pruritus, unspecified: Secondary | ICD-10-CM | POA: Diagnosis not present

## 2019-03-19 DIAGNOSIS — O99712 Diseases of the skin and subcutaneous tissue complicating pregnancy, second trimester: Secondary | ICD-10-CM | POA: Diagnosis not present

## 2019-04-16 DIAGNOSIS — Z23 Encounter for immunization: Secondary | ICD-10-CM | POA: Diagnosis not present

## 2019-04-16 DIAGNOSIS — Z3400 Encounter for supervision of normal first pregnancy, unspecified trimester: Secondary | ICD-10-CM | POA: Diagnosis not present

## 2019-06-22 LAB — OB RESULTS CONSOLE RPR: RPR: NONREACTIVE

## 2019-06-22 LAB — OB RESULTS CONSOLE GC/CHLAMYDIA
Chlamydia: NEGATIVE
Gonorrhea: NEGATIVE

## 2019-06-22 LAB — OB RESULTS CONSOLE GBS: GBS: POSITIVE

## 2019-06-22 LAB — OB RESULTS CONSOLE HIV ANTIBODY (ROUTINE TESTING): HIV: NONREACTIVE

## 2019-07-12 ENCOUNTER — Inpatient Hospital Stay
Admission: EM | Admit: 2019-07-12 | Discharge: 2019-07-14 | DRG: 787 | Disposition: A | Discharge status: Home / Self Care | Payer: 59 | Attending: Obstetrics & Gynecology | Admitting: Obstetrics & Gynecology

## 2019-07-12 ENCOUNTER — Inpatient Hospital Stay: Payer: 59 | Admitting: Anesthesiology

## 2019-07-12 ENCOUNTER — Other Ambulatory Visit: Payer: Self-pay

## 2019-07-12 ENCOUNTER — Encounter: Payer: Self-pay | Admitting: Certified Nurse Midwife

## 2019-07-12 DIAGNOSIS — O9081 Anemia of the puerperium: Secondary | ICD-10-CM | POA: Diagnosis not present

## 2019-07-12 DIAGNOSIS — Z3A39 39 weeks gestation of pregnancy: Secondary | ICD-10-CM | POA: Diagnosis not present

## 2019-07-12 DIAGNOSIS — Z1159 Encounter for screening for other viral diseases: Secondary | ICD-10-CM | POA: Diagnosis not present

## 2019-07-12 DIAGNOSIS — O4292 Full-term premature rupture of membranes, unspecified as to length of time between rupture and onset of labor: Principal | ICD-10-CM | POA: Diagnosis present

## 2019-07-12 DIAGNOSIS — D62 Acute posthemorrhagic anemia: Secondary | ICD-10-CM | POA: Diagnosis not present

## 2019-07-12 DIAGNOSIS — O99824 Streptococcus B carrier state complicating childbirth: Secondary | ICD-10-CM | POA: Diagnosis present

## 2019-07-12 LAB — TYPE AND SCREEN
ABO/RH(D): A POS
Antibody Screen: NEGATIVE

## 2019-07-12 LAB — CBC
HCT: 37.5 % (ref 36.0–46.0)
Hemoglobin: 12.2 g/dL (ref 12.0–15.0)
MCH: 28.7 pg (ref 26.0–34.0)
MCHC: 32.5 g/dL (ref 30.0–36.0)
MCV: 88.2 fL (ref 80.0–100.0)
Platelets: 244 10*3/uL (ref 150–400)
RBC: 4.25 MIL/uL (ref 3.87–5.11)
RDW: 14.7 % (ref 11.5–15.5)
WBC: 15.3 10*3/uL — ABNORMAL HIGH (ref 4.0–10.5)
nRBC: 0 % (ref 0.0–0.2)

## 2019-07-12 LAB — SARS CORONAVIRUS 2 BY RT PCR (HOSPITAL ORDER, PERFORMED IN ~~LOC~~ HOSPITAL LAB): SARS Coronavirus 2: NEGATIVE

## 2019-07-12 MED ORDER — LIDOCAINE-EPINEPHRINE (PF) 1.5 %-1:200000 IJ SOLN
INTRAMUSCULAR | Status: DC | PRN
  Administered 2019-07-12: 4 mL via PERINEURAL

## 2019-07-12 MED ORDER — OXYTOCIN 10 UNIT/ML IJ SOLN
INTRAMUSCULAR | Status: AC
  Filled 2019-07-12: qty 2

## 2019-07-12 MED ORDER — OXYTOCIN 40 UNITS IN NORMAL SALINE INFUSION - SIMPLE MED
2.5000 [IU]/h | INTRAVENOUS | Status: DC
  Filled 2019-07-12 (×2): qty 1000

## 2019-07-12 MED ORDER — LIDOCAINE HCL (PF) 1 % IJ SOLN
INTRAMUSCULAR | Status: AC
  Filled 2019-07-12: qty 30

## 2019-07-12 MED ORDER — AMMONIA AROMATIC IN INHA
RESPIRATORY_TRACT | Status: AC
  Filled 2019-07-12: qty 10

## 2019-07-12 MED ORDER — LIDOCAINE HCL (PF) 1 % IJ SOLN
INTRAMUSCULAR | Status: DC | PRN
  Administered 2019-07-12: 4 mL via INTRADERMAL

## 2019-07-12 MED ORDER — OXYTOCIN 40 UNITS IN NORMAL SALINE INFUSION - SIMPLE MED
1.0000 m[IU]/min | INTRAVENOUS | Status: DC
  Administered 2019-07-13: 400 mL via INTRAVENOUS

## 2019-07-12 MED ORDER — SODIUM CHLORIDE 0.9 % IV SOLN
5.0000 10*6.[IU] | Freq: Once | INTRAVENOUS | Status: AC
  Administered 2019-07-12: 5 10*6.[IU] via INTRAVENOUS
  Filled 2019-07-12: qty 5

## 2019-07-12 MED ORDER — ACETAMINOPHEN 500 MG PO TABS
1000.0000 mg | ORAL_TABLET | Freq: Four times a day (QID) | ORAL | Status: DC | PRN
Start: 2019-07-12 — End: 2019-07-13

## 2019-07-12 MED ORDER — LIDOCAINE HCL (PF) 1 % IJ SOLN: 30.0000 mL | INTRAMUSCULAR | Status: DC | PRN

## 2019-07-12 MED ORDER — PENICILLIN G 3 MILLION UNITS IVPB - SIMPLE MED
3.0000 10*6.[IU] | INTRAVENOUS | Status: DC
  Administered 2019-07-12: 22:00:00 3 10*6.[IU] via INTRAVENOUS
  Filled 2019-07-12: qty 100

## 2019-07-12 MED ORDER — LACTATED RINGERS IV SOLN: 500.0000 mL | INTRAVENOUS | Status: DC | PRN

## 2019-07-12 MED ORDER — MISOPROSTOL 200 MCG PO TABS
ORAL_TABLET | ORAL | Status: AC
  Filled 2019-07-12: qty 4

## 2019-07-12 MED ORDER — OXYTOCIN BOLUS FROM INFUSION: 500.0000 mL | Freq: Once | INTRAVENOUS | Status: DC

## 2019-07-12 MED ORDER — PHENYLEPHRINE 40 MCG/ML (10ML) SYRINGE FOR IV PUSH (FOR BLOOD PRESSURE SUPPORT): 80.0000 ug | PREFILLED_SYRINGE | INTRAVENOUS | Status: DC | PRN

## 2019-07-12 MED ORDER — EPHEDRINE 5 MG/ML INJ
10.0000 mg | INTRAVENOUS | Status: DC | PRN
  Administered 2019-07-12: 21:00:00 10 mg via INTRAVENOUS
  Filled 2019-07-12 (×2): qty 4

## 2019-07-12 MED ORDER — TERBUTALINE SULFATE 1 MG/ML IJ SOLN: 0.2500 mg | Freq: Once | INTRAMUSCULAR | Status: DC | PRN

## 2019-07-12 MED ORDER — SOD CITRATE-CITRIC ACID 500-334 MG/5ML PO SOLN: 30.0000 mL | ORAL | Status: DC | PRN

## 2019-07-12 MED ORDER — BUTORPHANOL TARTRATE 2 MG/ML IJ SOLN: 1.0000 mg | INTRAMUSCULAR | Status: DC | PRN

## 2019-07-12 MED ORDER — FENTANYL 2.5 MCG/ML W/ROPIVACAINE 0.15% IN NS 100 ML EPIDURAL (ARMC)
EPIDURAL | Status: AC
  Filled 2019-07-12: qty 100

## 2019-07-12 MED ORDER — EPHEDRINE 5 MG/ML INJ
10.0000 mg | INTRAVENOUS | Status: AC | PRN
  Administered 2019-07-12 (×2): 10 mg via INTRAVENOUS

## 2019-07-12 MED ORDER — BUPIVACAINE HCL (PF) 0.25 % IJ SOLN
INTRAMUSCULAR | Status: DC | PRN
  Administered 2019-07-12 (×2): 4 mL via EPIDURAL

## 2019-07-12 MED ORDER — DIPHENHYDRAMINE HCL 50 MG/ML IJ SOLN: 12.5000 mg | INTRAMUSCULAR | Status: DC | PRN

## 2019-07-12 MED ORDER — ONDANSETRON HCL 4 MG/2ML IJ SOLN: 4.0000 mg | Freq: Four times a day (QID) | INTRAMUSCULAR | Status: DC | PRN

## 2019-07-12 MED ORDER — LACTATED RINGERS IV SOLN
INTRAVENOUS | Status: DC
  Administered 2019-07-12 (×2): via INTRAVENOUS

## 2019-07-12 MED ORDER — FENTANYL 2.5 MCG/ML W/ROPIVACAINE 0.15% IN NS 100 ML EPIDURAL (ARMC)
12.0000 mL/h | EPIDURAL | Status: DC
  Administered 2019-07-12: 12 mL/h via EPIDURAL

## 2019-07-12 MED ORDER — MISOPROSTOL 25 MCG QUARTER TABLET
25.0000 ug | ORAL_TABLET | ORAL | Status: DC | PRN
  Administered 2019-07-12: 25 ug via BUCCAL
  Filled 2019-07-12: qty 1

## 2019-07-12 MED ORDER — LACTATED RINGERS IV SOLN: 500.0000 mL | Freq: Once | INTRAVENOUS | Status: DC

## 2019-07-12 NOTE — OB Triage Note (Signed)
Pt states that around 7AM she started leaking fluid that started as blood tinged and is now yellow. Contractions started at 4AM. She I snow having them 15-20 minutes apart and she rates them 4/10. Denies vaginal bleeding. Endorses + fetal movement. GBS positive.

## 2019-07-12 NOTE — Anesthesia Procedure Notes (Signed)
Epidural Patient location during procedure: OB  Staffing Performed: anesthesiologist   Preanesthetic Checklist Completed: patient identified, site marked, surgical consent, pre-op evaluation, timeout performed, IV checked, risks and benefits discussed and monitors and equipment checked  Epidural Patient position: sitting Prep: Betadine Patient monitoring: heart rate, continuous pulse ox and blood pressure Approach: midline Location: L4-L5 Injection technique: LOR saline  Needle:  Needle type: Tuohy  Needle gauge: 17 G Needle length: 9 cm and 9 Needle insertion depth: 7 cm Catheter type: closed end flexible Catheter size: 19 Gauge Catheter at skin depth: 13 cm Test dose: negative and 1.5% lidocaine with Epi 1:200 K  Assessment Sensory level: T10 Events: blood not aspirated, injection not painful, no injection resistance, negative IV test and no paresthesia  Additional Notes   Patient tolerated the insertion well without complications.-SATD -IVTD. No paresthesia. Refer to [place] for VS and dosingReason for block:procedure for pain

## 2019-07-12 NOTE — Anesthesia Preprocedure Evaluation (Signed)
Anesthesia Evaluation  Patient identified by MRN, date of birth, ID band Patient awake    Reviewed: Allergy & Precautions, H&P , NPO status , Patient's Chart, lab work & pertinent test results, reviewed documented beta blocker date and time   Airway Mallampati: II  TM Distance: >3 FB Neck ROM: full    Dental no notable dental hx. (+) Teeth Intact   Pulmonary neg pulmonary ROS, Current Smoker,    Pulmonary exam normal breath sounds clear to auscultation       Cardiovascular Exercise Tolerance: Good negative cardio ROS   Rhythm:regular Rate:Normal     Neuro/Psych negative neurological ROS  negative psych ROS   GI/Hepatic negative GI ROS, Neg liver ROS,   Endo/Other  negative endocrine ROSdiabetes  Renal/GU      Musculoskeletal   Abdominal   Peds  Hematology negative hematology ROS (+)   Anesthesia Other Findings   Reproductive/Obstetrics (+) Pregnancy                             Anesthesia Physical Anesthesia Plan  ASA: II  Anesthesia Plan: Epidural   Post-op Pain Management:    Induction:   PONV Risk Score and Plan:   Airway Management Planned:   Additional Equipment:   Intra-op Plan:   Post-operative Plan:   Informed Consent: I have reviewed the patients History and Physical, chart, labs and discussed the procedure including the risks, benefits and alternatives for the proposed anesthesia with the patient or authorized representative who has indicated his/her understanding and acceptance.       Plan Discussed with:   Anesthesia Plan Comments:         Anesthesia Quick Evaluation

## 2019-07-12 NOTE — H&P (Signed)
OB History & Physical   History of Present Illness:  Chief Complaint:   HPI:  KEIANA TAVELLA is a 33 y.o. G1P0 female at [redacted]w[redacted]d dated by  Patient's last menstrual period was 08/06/2018.  She presents to L&D for rupture of membranes for clear fluid this morning.  +FM, no CTX, + LOF, no Vb  Pregnancy Issues: 1. GBS+ 2. Shellfish/peanut allergy  Maternal Medical History:   Past Medical History:  Diagnosis Date  . Allergy   . Anxiety, mild 06/04/2015  . Fibromyalgia syndrome 09/23/2015  . Migraine     History reviewed. No pertinent surgical history.  Allergies  Allergen Reactions  . Shellfish Allergy Other (See Comments)  . Apple   . Peanuts  [Peanut Oil]     Other reaction(s): SHORTNESS OF BREATH  . Shellfish-Derived Products     Other reaction(s): Other (See Comments)    Prior to Admission medications   Medication Sig Start Date End Date Taking? Authorizing Provider  Prenatal Vit-Fe Fumarate-FA (MULTIVITAMIN-PRENATAL) 27-0.8 MG TABS tablet Take 1 tablet by mouth daily at 12 noon. 08/08/18  Yes Lada, Satira Anis, MD  amitriptyline (ELAVIL) 25 MG tablet Take 1 tablet (25 mg total) by mouth at bedtime. 10/05/18 11/04/18  Lin Landsman, MD     Prenatal care site: [place]    Social History: She  reports that she has never smoked. She has never used smokeless tobacco. She reports that she does not drink alcohol or use drugs.  Family History: family history includes Depression in her father and mother.   Review of Systems: A full review of systems was performed and negative except as noted in the HPI.     Physical Exam:  Vital Signs: BP 118/74 (BP Location: Left Arm)   Pulse (!) 103   Temp 98.3 F (36.8 C) (Oral)   Resp 18   Ht 5\' 7"  (1.702 m)   Wt 93.4 kg   LMP 08/06/2018   BMI 32.26 kg/m  General: no acute distress.  HEENT: normocephalic, atraumatic Heart: regular rate & rhythm.  No murmurs/rubs/gallops Lungs: clear to auscultation  bilaterally, normal respiratory effort Abdomen: soft, gravid, non-tender;  EFW: 7lb 9oz Pelvic:   External: Normal external female genitalia  Cervix: Dilation: 3 / Effacement (%): 60 / Station: -2    Extremities: non-tender, symmetric, mild edema bilaterally.  DTRs: 2+ Neurologic: Alert & oriented x 3.      Pertinent Results:  Prenatal Labs: Blood type/Rh A+  Antibody screen neg  Rubella Immune  Varicella Immune  RPR NR  HBsAg Neg  HIV NR  GC neg  Chlamydia neg  Genetic screening declined  1 hour GTT 101  3 hour GTT   GBS positive   FHT: 140 mod +accels no decels TOCO: rare SVE:  Dilation: 3 / Effacement (%): 60 / Station: -2    Cephalic by leopolds  Assessment:  GLADIOLA MADORE is a 33 y.o. G1P0 female at [redacted]w[redacted]d with PROM.   Plan:  1. Admit to Labor & Delivery 2. CBC, T&S, Clrs, IVF 3. GBS positive - PCN ordered  4. Consents obtained. 5. Continuous efm/toco 6. Category 1 7. IOL with cytotec, buccally, and await labor  ----- Larey Days, MD Attending Obstetrician and Gynecologist [facility], Department of [facility]

## 2019-07-13 ENCOUNTER — Inpatient Hospital Stay: Payer: 59

## 2019-07-13 ENCOUNTER — Encounter
Admission: EM | Disposition: A | Discharge status: Home / Self Care | Payer: Self-pay | Source: Home / Self Care | Attending: Obstetrics & Gynecology

## 2019-07-13 LAB — CBC
HCT: 31.5 % — ABNORMAL LOW (ref 36.0–46.0)
Hemoglobin: 10.1 g/dL — ABNORMAL LOW (ref 12.0–15.0)
MCH: 28.5 pg (ref 26.0–34.0)
MCHC: 32.1 g/dL (ref 30.0–36.0)
MCV: 88.7 fL (ref 80.0–100.0)
Platelets: 213 10*3/uL (ref 150–400)
RBC: 3.55 MIL/uL — ABNORMAL LOW (ref 3.87–5.11)
RDW: 14.8 % (ref 11.5–15.5)
WBC: 21.5 10*3/uL — ABNORMAL HIGH (ref 4.0–10.5)
nRBC: 0 % (ref 0.0–0.2)

## 2019-07-13 SURGERY — Surgical Case
Anesthesia: Epidural | Site: Abdomen

## 2019-07-13 MED ORDER — ONDANSETRON HCL 4 MG/2ML IJ SOLN
INTRAMUSCULAR | Status: DC | PRN
  Administered 2019-07-13: 4 mg via INTRAVENOUS

## 2019-07-13 MED ORDER — DIPHENHYDRAMINE HCL 25 MG PO CAPS: 25.0000 mg | ORAL_CAPSULE | ORAL | Status: DC | PRN

## 2019-07-13 MED ORDER — COCONUT OIL OIL: 1.0000 "application " | TOPICAL_OIL | Status: DC | PRN

## 2019-07-13 MED ORDER — ONDANSETRON HCL 4 MG/2ML IJ SOLN: 4.0000 mg | Freq: Once | INTRAMUSCULAR | Status: DC | PRN

## 2019-07-13 MED ORDER — ACETAMINOPHEN 500 MG PO TABS
1000.0000 mg | ORAL_TABLET | Freq: Four times a day (QID) | ORAL | Status: DC
  Administered 2019-07-13 – 2019-07-14 (×4): 1000 mg via ORAL
  Filled 2019-07-13 (×5): qty 2

## 2019-07-13 MED ORDER — LACTATED RINGERS IV SOLN: INTRAVENOUS | Status: DC

## 2019-07-13 MED ORDER — SODIUM CHLORIDE 0.9 % IV SOLN: 250.0000 mL | INTRAVENOUS | Status: DC

## 2019-07-13 MED ORDER — SODIUM CHLORIDE (PF) 0.9 % IJ SOLN
INTRAMUSCULAR | Status: AC
  Filled 2019-07-13: qty 50

## 2019-07-13 MED ORDER — FERROUS SULFATE 325 (65 FE) MG PO TABS: 325.0000 mg | ORAL_TABLET | Freq: Two times a day (BID) | ORAL | 1 refills | Status: DC

## 2019-07-13 MED ORDER — ACETAMINOPHEN 500 MG PO TABS: 1000.0000 mg | ORAL_TABLET | Freq: Four times a day (QID) | ORAL | Status: DC

## 2019-07-13 MED ORDER — GABAPENTIN 100 MG PO CAPS
100.0000 mg | ORAL_CAPSULE | Freq: Every day | ORAL | Status: DC
  Administered 2019-07-13: 100 mg via ORAL
  Filled 2019-07-13: qty 1

## 2019-07-13 MED ORDER — DIBUCAINE (PERIANAL) 1 % EX OINT: 1.0000 "application " | TOPICAL_OINTMENT | CUTANEOUS | Status: DC | PRN

## 2019-07-13 MED ORDER — OXYTOCIN 40 UNITS IN NORMAL SALINE INFUSION - SIMPLE MED
INTRAVENOUS | Status: AC
  Filled 2019-07-13: qty 1000

## 2019-07-13 MED ORDER — BUPIVACAINE LIPOSOME 1.3 % IJ SUSP
INTRAMUSCULAR | Status: DC | PRN
  Administered 2019-07-13: 100 mL

## 2019-07-13 MED ORDER — PHENYLEPHRINE HCL (PRESSORS) 10 MG/ML IV SOLN
INTRAVENOUS | Status: DC | PRN
  Administered 2019-07-13: 200 ug via INTRAVENOUS
  Administered 2019-07-13: 100 ug via INTRAVENOUS

## 2019-07-13 MED ORDER — KETOROLAC TROMETHAMINE 30 MG/ML IJ SOLN: 30.0000 mg | Freq: Four times a day (QID) | INTRAMUSCULAR | Status: AC | PRN

## 2019-07-13 MED ORDER — LIDOCAINE HCL 1 % IJ SOLN
INTRAMUSCULAR | Status: DC | PRN
  Administered 2019-07-13: 7 mL via INTRADERMAL

## 2019-07-13 MED ORDER — MORPHINE SULFATE (PF) 0.5 MG/ML IJ SOLN
INTRAMUSCULAR | Status: DC | PRN
  Administered 2019-07-13: 2 mg via EPIDURAL

## 2019-07-13 MED ORDER — DIPHENHYDRAMINE HCL 50 MG/ML IJ SOLN: 12.5000 mg | INTRAMUSCULAR | Status: DC | PRN

## 2019-07-13 MED ORDER — MIDAZOLAM HCL 2 MG/2ML IJ SOLN
INTRAMUSCULAR | Status: DC | PRN
  Administered 2019-07-13: 2 mg via INTRAVENOUS

## 2019-07-13 MED ORDER — SODIUM CHLORIDE 0.9% FLUSH: 3.0000 mL | Freq: Two times a day (BID) | INTRAVENOUS | Status: DC

## 2019-07-13 MED ORDER — NALBUPHINE HCL 10 MG/ML IJ SOLN: 5.0000 mg | INTRAMUSCULAR | Status: DC | PRN

## 2019-07-13 MED ORDER — DIPHENHYDRAMINE HCL 25 MG PO CAPS: 25.0000 mg | ORAL_CAPSULE | Freq: Four times a day (QID) | ORAL | Status: DC | PRN

## 2019-07-13 MED ORDER — SODIUM CHLORIDE 0.9% FLUSH: 3.0000 mL | INTRAVENOUS | Status: DC | PRN

## 2019-07-13 MED ORDER — SENNOSIDES-DOCUSATE SODIUM 8.6-50 MG PO TABS: 2.0000 | ORAL_TABLET | Freq: Every day | ORAL | 0 refills | Status: DC

## 2019-07-13 MED ORDER — FENTANYL CITRATE (PF) 100 MCG/2ML IJ SOLN: 25.0000 ug | INTRAMUSCULAR | Status: DC | PRN

## 2019-07-13 MED ORDER — OXYTOCIN 40 UNITS IN NORMAL SALINE INFUSION - SIMPLE MED: 2.5000 [IU]/h | INTRAVENOUS | Status: AC

## 2019-07-13 MED ORDER — KETOROLAC TROMETHAMINE 30 MG/ML IJ SOLN
INTRAMUSCULAR | Status: DC | PRN
  Administered 2019-07-13: 30 mg via INTRAVENOUS

## 2019-07-13 MED ORDER — IBUPROFEN 800 MG PO TABS
800.0000 mg | ORAL_TABLET | Freq: Three times a day (TID) | ORAL | Status: DC
  Administered 2019-07-13 – 2019-07-14 (×4): 800 mg via ORAL
  Filled 2019-07-13 (×5): qty 1

## 2019-07-13 MED ORDER — ACETAMINOPHEN 500 MG PO TABS: 1000.0000 mg | ORAL_TABLET | Freq: Four times a day (QID) | ORAL | 0 refills | Status: DC

## 2019-07-13 MED ORDER — SIMETHICONE 80 MG PO CHEW
160.0000 mg | CHEWABLE_TABLET | Freq: Four times a day (QID) | ORAL | Status: DC | PRN
  Administered 2019-07-13: 160 mg via ORAL
  Filled 2019-07-13: qty 2

## 2019-07-13 MED ORDER — PRENATAL MULTIVITAMIN CH
1.0000 | ORAL_TABLET | Freq: Every day | ORAL | Status: DC
  Administered 2019-07-13: 1 via ORAL
  Filled 2019-07-13: qty 1

## 2019-07-13 MED ORDER — CEFAZOLIN SODIUM-DEXTROSE 2-3 GM-%(50ML) IV SOLR
INTRAVENOUS | Status: DC | PRN
  Administered 2019-07-13: 2 g via INTRAVENOUS

## 2019-07-13 MED ORDER — LIDOCAINE 2% (20 MG/ML) 5 ML SYRINGE
INTRAMUSCULAR | Status: DC | PRN
  Administered 2019-07-13: 100 mg via INTRAVENOUS

## 2019-07-13 MED ORDER — OXYCODONE HCL 5 MG PO TABS: 5.0000 mg | ORAL_TABLET | ORAL | 0 refills | Status: AC | PRN

## 2019-07-13 MED ORDER — NALOXONE HCL 0.4 MG/ML IJ SOLN
INTRAMUSCULAR | Status: AC
  Filled 2019-07-13: qty 1

## 2019-07-13 MED ORDER — BUPIVACAINE LIPOSOME 1.3 % IJ SUSP
INTRAMUSCULAR | Status: AC
  Filled 2019-07-13: qty 20

## 2019-07-13 MED ORDER — ONDANSETRON HCL 4 MG/2ML IJ SOLN: 4.0000 mg | Freq: Three times a day (TID) | INTRAMUSCULAR | Status: DC | PRN

## 2019-07-13 MED ORDER — MENTHOL 3 MG MT LOZG
1.0000 | LOZENGE | OROMUCOSAL | Status: DC | PRN
  Filled 2019-07-13: qty 9

## 2019-07-13 MED ORDER — IBUPROFEN 800 MG PO TABS: 800.0000 mg | ORAL_TABLET | Freq: Three times a day (TID) | ORAL | 0 refills | Status: DC

## 2019-07-13 MED ORDER — SIMETHICONE 80 MG PO CHEW: 160.0000 mg | CHEWABLE_TABLET | Freq: Four times a day (QID) | ORAL | 0 refills | Status: DC | PRN

## 2019-07-13 MED ORDER — WITCH HAZEL-GLYCERIN EX PADS: 1.0000 "application " | MEDICATED_PAD | CUTANEOUS | Status: DC | PRN

## 2019-07-13 MED ORDER — MEPERIDINE HCL 25 MG/ML IJ SOLN: 6.2500 mg | INTRAMUSCULAR | Status: DC | PRN

## 2019-07-13 MED ORDER — NALOXONE HCL 0.4 MG/ML IJ SOLN: 0.4000 mg | INTRAMUSCULAR | Status: DC | PRN

## 2019-07-13 MED ORDER — NALOXONE HCL 2 MG/2ML IJ SOSY
PREFILLED_SYRINGE | INTRAMUSCULAR | Status: AC
  Filled 2019-07-13: qty 2

## 2019-07-13 MED ORDER — OXYCODONE HCL 5 MG PO TABS
5.0000 mg | ORAL_TABLET | ORAL | Status: DC | PRN
  Administered 2019-07-13: 21:00:00 5 mg via ORAL
  Administered 2019-07-14 (×2): 10 mg via ORAL
  Filled 2019-07-13: qty 2
  Filled 2019-07-13: qty 1
  Filled 2019-07-13: qty 2

## 2019-07-13 MED ORDER — BUPIVACAINE HCL (PF) 0.5 % IJ SOLN
INTRAMUSCULAR | Status: AC
  Filled 2019-07-13: qty 30

## 2019-07-13 MED ORDER — GABAPENTIN 100 MG PO CAPS: 100.0000 mg | ORAL_CAPSULE | Freq: Every day | ORAL | 0 refills | Status: DC

## 2019-07-13 MED ORDER — SENNOSIDES-DOCUSATE SODIUM 8.6-50 MG PO TABS
2.0000 | ORAL_TABLET | ORAL | Status: DC
  Administered 2019-07-14: 09:00:00 2 via ORAL
  Filled 2019-07-13: qty 2

## 2019-07-13 SURGICAL SUPPLY — 40 items
ADH SKN CLS APL DERMABOND .7 (GAUZE/BANDAGES/DRESSINGS) ×2
CANISTER SUCT 3000ML PPV (MISCELLANEOUS) ×2 IMPLANT
COVER WAND RF STERILE (DRAPES) ×2 IMPLANT
DERMABOND ADVANCED (GAUZE/BANDAGES/DRESSINGS) ×2
DERMABOND ADVANCED .7 DNX12 (GAUZE/BANDAGES/DRESSINGS) ×1 IMPLANT
DRSG TELFA 3X8 NADH (GAUZE/BANDAGES/DRESSINGS) IMPLANT
ELECT CAUTERY BLADE 6.4 (BLADE) ×2 IMPLANT
ELECT REM PT RETURN 9FT ADLT (ELECTROSURGICAL) ×2
ELECTRODE REM PT RTRN 9FT ADLT (ELECTROSURGICAL) ×1 IMPLANT
GAUZE SPONGE 4X4 12PLY STRL (GAUZE/BANDAGES/DRESSINGS) ×1 IMPLANT
GLOVE PI ORTHOPRO 6.5 (GLOVE) ×1
GLOVE PI ORTHOPRO STRL 6.5 (GLOVE) ×1 IMPLANT
GLOVE SURG SYN 6.5 ES PF (GLOVE) ×10 IMPLANT
GLOVE SURG SYN 6.5 PF PI (GLOVE) ×1 IMPLANT
GOWN STRL REUS W/ TWL LRG LVL3 (GOWN DISPOSABLE) ×3 IMPLANT
GOWN STRL REUS W/TWL LRG LVL3 (GOWN DISPOSABLE) ×6
NS IRRIG 1000ML POUR BTL (IV SOLUTION) ×2 IMPLANT
PACK C SECTION AR (MISCELLANEOUS) ×2 IMPLANT
PAD DRESSING TELFA 3X8 NADH (GAUZE/BANDAGES/DRESSINGS) ×1 IMPLANT
PAD OB MATERNITY 4.3X12.25 (PERSONAL CARE ITEMS) ×3 IMPLANT
PAD PREP 24X41 OB/GYN DISP (PERSONAL CARE ITEMS) ×2 IMPLANT
PENCIL SMOKE ULTRAEVAC 22 CON (MISCELLANEOUS) ×2 IMPLANT
SCALPEL PROTECTED #10 DISP (BLADE) ×1 IMPLANT
SPONGE LAP 18X18 RF (DISPOSABLE) ×1 IMPLANT
STRAP SAFETY 5IN WIDE (MISCELLANEOUS) ×2 IMPLANT
STRIP CLOSURE SKIN 1/2X4 (GAUZE/BANDAGES/DRESSINGS) ×1 IMPLANT
SUT MNCRL 4-0 (SUTURE) ×2
SUT MNCRL 4-0 27XMFL (SUTURE) ×1
SUT PDS AB 1 TP1 96 (SUTURE) ×1 IMPLANT
SUT VIC AB 0 CT1 36 (SUTURE) ×3 IMPLANT
SUT VIC AB 0 CTX 36 (SUTURE) ×6
SUT VIC AB 0 CTX36XBRD ANBCTRL (SUTURE) IMPLANT
SUT VIC AB 2-0 CT1 27 (SUTURE)
SUT VIC AB 2-0 CT1 TAPERPNT 27 (SUTURE) ×1 IMPLANT
SUT VIC AB 3-0 SH 27 (SUTURE) ×2
SUT VIC AB 3-0 SH 27X BRD (SUTURE) ×1 IMPLANT
SUTURE MNCRL 4-0 27XMF (SUTURE) ×1 IMPLANT
SWABSTK COMLB BENZOIN TINCTURE (MISCELLANEOUS) ×1 IMPLANT
SYR 30ML LL (SYRINGE) ×3 IMPLANT
TRAY FOLEY MTR SLVR 16FR STAT (SET/KITS/TRAYS/PACK) ×1 IMPLANT

## 2019-07-13 NOTE — Anesthesia Post-op Follow-up Note (Signed)
  Anesthesia Pain Follow-up Note  Patient: Beth Cox  Day #: 1  Date of Follow-up: 07/13/2019 Time: 8:48 AM  Last Vitals:  Vitals:   07/13/19 0630 07/13/19 0755  BP: 100/73 105/68  Pulse: 94 81  Resp: 20 18  Temp: 37 C 36.8 C  SpO2: 99% 99%    Level of Consciousness: alert  Pain: none   Side Effects:None  Catheter Site Exam:clean, dry, no drainage     Plan: D/C from anesthesia care at surgeon's request  Alison Stalling

## 2019-07-13 NOTE — Lactation Note (Signed)
Lactation Consultation Note  Patient Name: Beth Cox'V Date: 07/13/2019     Maternal Data   Mom recently pumped breasts and obtained drops, not able to collect this milk, she has had breastfeeding education prior, encouraged to pump every 3 hrs or at least 8x in 24 hrs, baby transferred to [facility] and mom plans to be d/c'd to see baby at [facility], reviewed care and storage of breast milk, she has a Medela PNS at home   Feeding    [facility] Score                   Interventions    Lactation Tools Discussed/Used     Consult Status      Ferol Luz 07/13/2019, 12:10 PM

## 2019-07-13 NOTE — Transfer of Care (Signed)
Immediate Anesthesia Transfer of Care Note  Patient: Beth Cox  Procedure(s) Performed: CESAREAN SECTION (N/A Abdomen)  Patient Location: PACU  Anesthesia Type:epidural  Level of Consciousness: awake, alert  and oriented  Airway & Oxygen Therapy: Patient Spontanous Breathing  Post-op Assessment: Report given to RN and Post -op Vital signs reviewed and stable  Post vital signs: Reviewed and stable  Last Vitals:  Vitals Value Taken Time  BP 106/78 07/13/19 0147  Temp 36.4 C 07/13/19 0147  Pulse 78 07/13/19 0147  Resp 23 07/13/19 0148  SpO2 100 % 07/13/19 0147  Vitals shown include unvalidated device data.  Last Pain:  Vitals:   07/13/19 0147  TempSrc:   PainSc: 0-No pain         Complications: No apparent anesthesia complications

## 2019-07-13 NOTE — Plan of Care (Signed)
To mother/baby unit around 0530; at [place] is to stand at bedside and if pt can tolerate this then foley can be removed; pt needs breast pump (baby was transferred to [facility])

## 2019-07-13 NOTE — Anesthesia Post-op Follow-up Note (Signed)
Anesthesia QCDR form completed.        

## 2019-07-13 NOTE — Op Note (Addendum)
Cesarean Section Procedure Note  07/13/2019   Patient:  Beth Cox  33 y.o. female at [redacted]w[redacted]d.  Patient's last menstrual period was 08/06/2018. Preoperative diagnosis:  1. Cord prolapse 2. Fetal intolerance of labor 3. Term pregnancy at 39+5  Postoperative diagnosis: 1. Cord prolapse 2. Fetal intolerance of labor 3. Term pregnancy at 39+5   4. Live born female  PROCEDURE:  Procedure(s): CESAREAN SECTION (N/A) Surgeon:  Surgeon(s) and Role:    * , Honor Loh, MD - Primary Anesthesia:  Epidural, lidocaine  I/O: Total I/O In: 800 [I.V.:800] Out: 200 [Blood:200]   Specimens:  Cord Blood for gases (arterial, venous), placenta Complications: None Apparent Disposition:  VS stable to PACU  Findings:  normal uterus with 1 x 2cm fundal serosal fibroid Normal appearing tubes and ovaries bilaterally  Live born female "Jaydan Chretien" Birth Weight:  3030g, 6lb 11oz APGAR: 1, 2  Newborn Delivery   Birth date/time: 07/13/2019 00:13:00 Delivery type: C-Section, Low Transverse Trial of labor: No C-section categorization: Primary       Indication for procedure: 33 y.o. female at [redacted]w[redacted]d who was admitted for gross rupture of membranes.  She was given adequate penicillin for GBS+ prophylaxis and cytotec for cervical ripening x1. Labor progressed spontaneously from there.  She received an epidural.  She continued to labor, and had a category 2 tracing throughout, due to maternal hypotension following epidural, and then with contractions which was presumed to be the cord being pressed. Multiple positions were attempted throughout labor.  IUPC was considered after being checked and 4cm, but then was found to be 9cm.  She proceeded to complete and began pushing.  With pushing there were deep variables but moderate variability and some accels in between. Occasional irregular cardiac rhythm was auscultated - the patterns were all unique, consistent rate, and all self-resolved. Around 23:44  arrest of station was established, and manual assessment of fetal position was performed, with questionable palpation of a nuchal cord vs cervix at anterior shoulder/ neck.  Operative delivery was discussed, cesarean vs vacuum.  Pros/cons reviewed.  Patient continued to push during the duration of this discussion, and following her last push, there was a bulge of cord that protruded anteriorly, and was pulsing.  STAT cesarean was called. RN rode the bed with fetal head elevated.  During the time of moving the bed out of the labor room and into the OR, positioned, and draped, the nurse felt a second loop of cord protrude, as well as loss of pulsation.    Procedure Details   Verbal consent was obtained.  The patient was moved to the operating room table as was the nurse holding the fetal head.  A sterile drape was placed with iodoband.  10cc of 1% lidocain was injected into the skin, and at that time it was noted that the epidural she had was providing adequate coverage to proceed without local anesthetic.  Using a 10 blade, a Pfannenstiel incision was made and carried down through the subcutaneous tissue, the fascia, and the rectus muscles were divided in the midline. The peritoneum was entered bluntly and then a low transverse uterine incision was made and stretched caudad and cephalad.  Initially the fetal head was unable to be extracted from the pelvis.  A brief attempt to find the feet to try to deliver breech was made, but the extremities were distal and difficult to reach safely.  A second attempt to deliver the fetal head, with effort from the RN elevating the  head and the head was brought out of the pelvis and delivered.  These maneuvers were performed in a matter of 20-30 seconds. Delivered from cephalic, OP presentation was a flaccid female, Immediately the scrubbed personnel clamped and cut the cord while I performed chest compressions and handed the neonate to the awaiting neonatal team. Delayed cord  clamping was not performed. A section of cord was obtained for umbilical arterial and venous gas assessment. The placenta was removed intact and appeared normal.  At this time, the scrubbed staff all took a moment to reorganize the instruments and surgical field. The uterus was delivered from the abdominal cavity and cleared of clots, membranes, and debris. The uterus, tubes and ovaries appeared normal. The uterine incision was closed with running locking sutures of 0 Vicryl, and then a second, imbricating stitch was placed. Hemostasis was observed. The abdominal cavity was evacuated of extraneous fluid. The uterus was returned to the abdominal cavity and again the incision was inspected for hemostasis, which was confirmed.  The paracolic gutters were cleaned. The fascia was then reapproximated with running suture of vicryl. 90cc of Long- and short-acting bupivicaine was injected circumferentially into the fascia.  After a change of gloves, the subcutaneous tissue was irrigated and reapproximated with 3-0 vicryl. The skin was closed with 4-0 Monocryl and 10cc of long- and short-acting bupivacaine injected into the skin and subcutaneous tissues.  The incision was covered with surgical glue.     Instrument, sponge, and needle counts were not performed at the start of this case, thus Xray was taken and no instruments, needles, or sponges were in the abdominal cavity at the conclusion of the case. Due to the STAT nature of this case, a time-out was also not performed. A foley catheter was placed prior to transfer out of the OR.   I was present and performed this procedure in its entirety.  VTE: SCDs Perioperative antibiotics: PCN 3 million units, Ancef 2g  ----- Larey Days, MD Attending Obstetrician and Gynecologist [facility], Department of [facility]

## 2019-07-13 NOTE — Discharge Summary (Addendum)
Obstetrical Discharge Summary  Patient Name: Beth Cox DOB: 1986/04/02 MRN: [id number]  Date of Admission: 07/12/2019 Date of Delivery: 07/13/19 Delivered by: Dr Vikki Ports Ward Date of Discharge: 07/14/2019  Primary OB: [place]  [id number] last menstrual period was 08/06/2018. EDC Estimated Date of Delivery: 07/15/19 Gestational Age at Delivery: [redacted]w[redacted]d   Antepartum complications:  1. Hx infertility 2. Size less than dates, growth at 37wks, WNL  Admitting Diagnosis: PROM, 39wks Secondary Diagnosis: Primary low transverse CS, cord prolapse  Patient Active Problem List   Diagnosis Date Noted  . Labor and delivery indication for care or intervention 07/12/2019  . Urinary frequency 08/02/2016  . Breast lump on left side at 5 o'clock position 08/02/2016  . Breast tenderness in female 08/02/2016  . Positive ANA (antinuclear antibody) 07/22/2016  . Menstrual period late 07/15/2016  . LLQ abdominal pain 07/15/2016  . Peripheral vascular disease, unspecified ([place]) 07/15/2016  . Fibromyalgia syndrome 09/23/2015  . Chronic pelvic pain in female 09/23/2015  . Constipation, slow transit 09/23/2015  . Allergic rhinitis with postnasal drip 07/25/2015  . Other fatigue 07/25/2015  . Anxiety, mild 06/04/2015  . Migraine without aura and without status migrainosus, not intractable     Augmentation: Cytotec Complications: Cord Prolapse -> emergency CS Intrapartum complications/course: Cord Prolapse -> emergency CS, see detailed Op note.  Date of Delivery: 07/13/19 Delivered By: Larey Days MD Delivery Type: primary cesarean section, low transverse incision Anesthesia: epidural Placenta: spontaneous Laceration: none Episiotomy: none Newborn Data: Live born female "[name]" Birth Weight: 6 lb 10.9 oz (3030 g) APGAR: 1, 2, 4  Newborn Delivery   Birth date/time: 07/13/2019 00:13:00 Delivery type: C-Section, Low Transverse Trial of labor: No C-section categorization:  Primary        Postpartum Procedures: none  Post partum course:   By time of discharge on POD#1, her pain was controlled on oral pain medications; she had appropriate lochia and was ambulating, voiding without difficulty, tolerating regular diet and passing flatus.   She was deemed stable for discharge to home due to infant in NICU at [facility] .    Discharge Physical Exam:  BP 106/67 (BP Location: Left Arm)   Pulse 78   Temp 98.1 F (36.7 C) (Oral)   Resp 18   Ht 5\' 7"  (1.702 m)   Wt 93.4 kg   LMP 08/06/2018   SpO2 100%   Breastfeeding Unknown   BMI 32.26 kg/m   General: NAD CV: RRR Pulm: CTABL, nl effort ABD: s/nd/nt, fundus firm and below the umbilicus Lochia: moderate Incision: c/d/i, healing well, no significant drainage, no dehiscence, no significant erythema DVT Evaluation: LE non-ttp, no evidence of DVT on exam.  Hemoglobin  Date Value Ref Range Status  07/13/2019 10.1 (L) 12.0 - 15.0 g/dL Final   HGB  Date Value Ref Range Status  07/03/2014 12.3 12.0 - 16.0 g/dL Final   HCT  Date Value Ref Range Status  07/13/2019 31.5 (L) 36.0 - 46.0 % Final  07/03/2014 37.1 35.0 - 47.0 % Final     Disposition: stable, discharge to home. Baby Feeding: planned breastfeeding, mom has DEBP setup.  Baby Disposition: transferred to [facility] after birth for cooling protocol.  Rh Immune globulin given: n/a Rubella vaccine given: immune Varicella vaccine given: immune Tdap vaccine given in AP setting: 04/16/19 Flu vaccine given in AP or PP setting: n/a  Contraception: condoms, NFP  Prenatal Labs:  Blood type/Rh A+  Antibody screen neg  Rubella Immune  Varicella Immune  RPR NR  HBsAg Neg  HIV NR  GC neg  Chlamydia neg  Genetic screening declined  1 hour GTT 101  3 hour GTT   GBS positive      Plan:  Beth Cox was discharged to home in good condition. Follow-up appointment with delivering provider in 6 weeks.  Discharge Medications: Allergies as of  07/14/2019      Reactions   Shellfish Allergy Other (See Comments)   Apple    Peanuts  [peanut Oil]    Other reaction(s): SHORTNESS OF BREATH   Shellfish-derived Products    Other reaction(s): Other (See Comments)      Medication List    STOP taking these medications   amitriptyline 25 MG tablet Commonly known as: ELAVIL     TAKE these medications   acetaminophen 500 MG tablet Commonly known as: TYLENOL Take 2 tablets (1,000 mg total) by mouth every 6 (six) hours.   ferrous sulfate 325 (65 FE) MG tablet Take 1 tablet (325 mg total) by mouth 2 (two) times daily with a meal. For anemia, take with Vitamin C   gabapentin 100 MG capsule Commonly known as: NEURONTIN Take 1 capsule (100 mg total) by mouth at bedtime for 3 days.   ibuprofen 800 MG tablet Commonly known as: ADVIL Take 1 tablet (800 mg total) by mouth every 8 (eight) hours.   multivitamin-prenatal 27-0.8 MG Tabs tablet Take 1 tablet by mouth daily at 12 noon.   oxyCODONE 5 MG immediate release tablet Commonly known as: Oxy IR/ROXICODONE Take 1 tablet (5 mg total) by mouth every 4 (four) hours as needed for up to 7 days for moderate pain or severe pain.   senna-docusate 8.6-50 MG tablet Commonly known as: Senokot-S Take 2 tablets by mouth at bedtime.   simethicone 80 MG chewable tablet Commonly known as: MYLICON Chew 2 tablets (160 mg total) by mouth 4 (four) times daily as needed for flatulence.       Follow-up Information    Ward, Honor Loh, MD Follow up in 2 week(s).   Specialty: Obstetrics and Gynecology Why: video visit for 2 week post-op appt Contact information: [address] [place] [address] [phone]           Signed:  Francetta Found, CNM 07/14/2019  11:56 AM

## 2019-07-13 NOTE — Progress Notes (Signed)
Summary of events from 7/16 pm and 7/17 surrounding labor and delivery of newborn.   Patient presented at 14:30 with reports of possible rupture of membranes.  She was found to be grossly ruptured and 3 / 58 / -2 by RN.  She was admitted and COVID testing was performed.  At 1740, given cytotec buccally with a category 1 tracing and PCN for GBS prophylaxis.   At 7pm I was notified by RN of change of shift, and that the fetal tracing had some variables but normal baseline, and moderate variability between variables.   At 21:54 I was paged by night RN to review strip:  Patient had epidural placed, after which there was hypotension and ephedrine was given.  Even with resolve of the blood pressure, patient still was having deep variables. Last check she was 4cm.   I arrived on the unit ~22:15 and remained at the bedside with the family.  My cervical check was 7cm, and RN checked behind me to make sure it was different from her previous exam.  Variable would occur and frequent position changes were performed.  She was placed in left side-lying release, and fetal heart tracing was normal in this position, with more shallow and less frequent variables.  She eventually progressed to complete in this position.  Patient preferred to push on her back and was positioned as such.   Patient only felt rectal pressure when contractions would occur.   We began pushing and coaching for effective pushing at 23:12. Intermittently with and between contractions there were deep variables with spontaneous recovery,and moderate / occasionally minimal variability between.  There were times in the 2nd stage that several independent and spontaneously resolving irregular cardiac rhythms were heard, although rate was normal. Each rhythm was unique.  At 23:40 I suggested that we either consider an operative delivery (vacuum) or cesarean delivery due to the fetal heart rate and inadequate descent convincing of a quickly forthcoming  delivery.  As we were discussing pros and cons of each, I began to assess the fetal head for position for consideration of operative delivery, as there was much molding.  As I encountered a fetal ear (right) I felt what I thought was a cervix, but realized was pulsing, and thought this must be a nuchal cord.  When I removed my finger from the ear, the cord followed.  I was able to reduce it back into the cavity, and we pushed the fetal head down in an attempt to go past the cord, which we did. My hand stayed on the fetal head for the remainder.  Because of this, between contractions, I felt a loop of cord descend from the anterior vagina.  At this time I called a STAT cesarean, and asked the attending RN to ride the bed and elevate the fetal head out of the pelvis and off the cord.  We detached the bed from the various plugs and made our way to the operating room.   The room was being set up simultaneously as we entered, moved the patient, and positioned her on the OR table.  She was draped, and because anesthesia had not arrived yet, I injected lidocaine into the skin to make an incision.  It was then I realized her epidural was adequate enough to continue rapidly.  The baby was born at 00:13, 9 minutes after the cord prolapsed.  See Op note.  Remainder of operation was routine.  Cord gases were collected.   Baby was bradycardic, and  all other APGAR evaluations were zero.  Initial APGAR was 1.  At 5 minutes it was 2.  He was brought to the NICU intubated.  He was found to warrant head cooling, and arrangements were made to transfer to [facility] NICU.     Mom was repaired in routine fashion, and minimal damage was made to the muscular layer, which was hemostatic.  See Op note.  ----- Larey Days, MD, [place] Attending Obstetrician and Gynecologist [facility], Department of [facility]

## 2019-07-13 NOTE — Anesthesia Postprocedure Evaluation (Signed)
Anesthesia Post Note  Patient: Beth Cox  Procedure(s) Performed: CESAREAN SECTION (N/A Abdomen)  Patient location during evaluation: Mother Baby Anesthesia Type: Epidural Level of consciousness: oriented and awake and alert Pain management: pain level controlled Vital Signs Assessment: post-procedure vital signs reviewed and stable Respiratory status: spontaneous breathing and respiratory function stable Cardiovascular status: blood pressure returned to baseline and stable Postop Assessment: no headache, no backache, no apparent nausea or vomiting and able to ambulate Anesthetic complications: no     Last Vitals:  Vitals:   07/13/19 0630 07/13/19 0755  BP: 100/73 105/68  Pulse: 94 81  Resp: 20 18  Temp: 37 C 36.8 C  SpO2: 99% 99%    Last Pain:  Vitals:   07/13/19 0755  TempSrc: Oral  PainSc:                  Alison Stalling

## 2019-07-13 NOTE — Progress Notes (Addendum)
Post Partum Day 0 Subjective: Doing well, no complaints.  Tolerating regular diet, pain with PO meds, has been OOB x 1 and foley cath removed this am. Pt desires DC today due to infant in NICU at [facility].   No CP SOB Fever,Chills, N/V or leg pain; denies nipple or breast pain, no HA change of vision, RUQ/epigastric pain  Objective: BP 104/70 (BP Location: Left Arm)   Pulse 79   Temp 97.8 F (36.6 C) (Oral)   Resp 20   Ht 5\' 7"  (1.702 m)   Wt 93.4 kg   LMP 08/06/2018   SpO2 100% Comment: Room Air  Breastfeeding Unknown   BMI 32.26 kg/m    Physical Exam:  General: NAD Breasts: soft/nontender CV: RRR Pulm: nl effort, CTABL Abdomen: soft, NT, BS x 4 Incision: no erythema or drainage Lochia: small Uterine Fundus: fundus firm and 1 fb below umbilicus DVT Evaluation: no cords, ttp LEs   Recent Labs    07/12/19 1700 07/13/19 0714  HGB 12.2 10.1*  HCT 37.5 31.5*  WBC 15.3* 21.5*  PLT 244 213    Assessment/Plan: 33 y.o. G1P1001 postpartum day # 0  - Continue routine PP care- encouraged ambulation, discussed able to do early DC once she is able to ambulate independently and void.  - Lactation consult- Needs double electric pump setup due to infant in NICU.   - Discussed contraceptive options including implant, IUDs hormonal and non-hormonal, injection, pills/ring/patch, condoms, and NFP.  -  Acute blood loss anemia - hemodynamically stable and asymptomatic; start po ferrous sulfate BID with stool softeners  - Immunization status:  all Imms up to date    Disposition: Does desire Dc home today.     Francetta Found, CNM 07/13/2019  12:01 PM  Addendum: Pt has decided to remain inpatient here overnight and DC tomorrow morning.   Francetta Found, CNM 07/13/2019 4:48 PM

## 2019-07-13 NOTE — Discharge Instructions (Signed)
Discharge Instructions:   Follow-up Appointment: Schedule your follow- up appointment for a video visit in 2 weeks ASAP!   If there are any new medications, they have been ordered and will be available for pickup at the listed pharmacy on your way home from the hospital.   Call office if you have any of the following: headache, visual changes, fever >101.0 F, chills, shortness of breath, breast concerns, excessive vaginal bleeding, incision drainage or problems, leg pain or redness, depression or any other concerns. If you have vaginal discharge with an odor, let your doctor know.   It is normal to bleed for up to 6 weeks. You should not soak through more than 1 pad in 1 hour. If you have a blood clot larger than your fist with continued bleeding, call your doctor.   After a c-section, you should expect a small amount of blood or clear fluid coming from the incision and abdominal cramping/soreness. Inspect your incision site daily. Stand in front of a mirror to look for any redness, incision opening, or discolored/odorness drainage. Take a shower daily and continue good hygiene. Use own towel and washcloth (do not share). Make sure your sheets on your bed are clean. No pets sleeping around your incision site. Dressing will be removed at your postpartum visit. If the dressing does become wet or soiled underneath, it is okay to remove it.   Activity: Do not lift > 10 lbs for 6 weeks (do not lift anything heavier than your baby). No intercourse, tampons, swimming pools, hot tubs, baths (only showers) for 6 weeks.  No driving for 1-2 weeks. Continue prenatal vitamin, especially if breastfeeding. Increase calories and fluids (water) while breastfeeding.   Your milk will come in, in the next couple of days (right now it is colostrum). You may have a slight fever when your milk comes in, but it should go away on its own.  If it does not, and rises above 101 F please call the doctor. You will also feel  achy and your breasts will be firm. They will also start to leak. If you are breastfeeding, continue as you have been and you can pump/express milk for comfort.   If you have too much milk, your breasts can become engorged, which could lead to mastitis. This is an infection of the milk ducts. It can be very painful and you will need to notify your doctor to obtain a prescription for antibiotics. You can also treat it with a shower or hot/cold compress.   For concerns about your baby, please call your pediatrician.  For breastfeeding concerns, the lactation consultant can be reached at [phone].   Postpartum blues (feelings of happy one minute and sad another minute) are normal for the first few weeks but if it gets worse let your doctor know.   Congratulations! We enjoyed caring for you and your new bundle of joy!

## 2019-07-14 LAB — RPR: RPR Ser Ql: NONREACTIVE

## 2019-07-14 NOTE — Progress Notes (Signed)
Post Partum Day 1 Subjective: Doing well, no complaints.  Tolerating regular diet, pain with PO meds, voiding and ambulating without difficulty.  No CP SOB Fever,Chills, N/V or leg pain; denies nipple or breast pain no HA change of vision, RUQ/epigastric pain  Objective: BP 106/67 (BP Location: Left Arm)   Pulse 78   Temp 98.1 F (36.7 C) (Oral)   Resp 18   Ht 5\' 7"  (1.702 m)   Wt 93.4 kg   LMP 08/06/2018   SpO2 100%   Breastfeeding Unknown   BMI 32.26 kg/m    Physical Exam:  General: NAD Breasts: soft/nontender has been pumping q3hrs and getting drops of colostrum. CV: RRR Pulm: nl effort, CTABL Abdomen: soft, NT, BS x 4 Incision: /no erythema or drainage Lochia: moderate Uterine Fundus: fundus firm and 2 fb below umbilicus DVT Evaluation: no cords, ttp LEs   Recent Labs    07/12/19 1700 07/13/19 0714  HGB 12.2 10.1*  HCT 37.5 31.5*  WBC 15.3* 21.5*  PLT 244 213    Assessment/Plan: 33 y.o. G1P1001 postpartum day # 1  - Continue routine PP care- plans DC today to be with her son, who is in NICU at [facility] and doing well.  - Lactation consult.  - Acute blood loss anemia - hemodynamically stable and asymptomatic; start po ferrous sulfate BID with stool softeners  - Immunization status:  all Imms up to date    Disposition: Does desire Dc home today.     Francetta Found, CNM 07/14/19 11:56 AM

## 2019-07-14 NOTE — Progress Notes (Signed)
Discharge instructions given and prescriptions sent to the pharmacy by provider. C-section spray kit, abdominal binder,  and correctly sized ted hose given. Patient verbalizes understanding of teaching. Patient discharged home via wheelchair at 1220.

## 2019-07-16 ENCOUNTER — Encounter: Payer: Self-pay | Admitting: Obstetrics & Gynecology

## 2019-07-16 LAB — SURGICAL PATHOLOGY

## 2019-07-23 ENCOUNTER — Ambulatory Visit: Payer: Self-pay

## 2019-07-23 NOTE — Lactation Note (Signed)
This note was copied from a baby's chart. Lactation Consultation Note  Patient Name: Beth Cox [id number] Date: 07/23/2019 Reason for consult: Follow-up assessment;Primapara;NICU baby;Term;Other (Comment)(Mom rooming in tonight for possible discharge tomorrow)  Mom rooming in with [name] in room 334 for possible discharge tomorrow. Mom brought 90 ml of expressed breast milk when she came in tonight to room in. [name] was born at [facility] 7/172020 via stat C/S for prolapsed cord with HIE with an apgar score of 1-2-4.  He was transferred to [facility] on the same day.  He was transferred back here 07/21/2019.  Mom's breasts are hard and full.  Demonstrated warmth, massage and hand expression.  Mom was able to get [name] latched for 5 minutes of sucking before he became fussy and refusing to re latch.  Father of baby gave him 35 ml of Similac 20 calorie formula via bottle while mom pumped.  Mom's breasts were softer after breast feeding and pumping.  Mom reports she has been pumping every 3 hours for 20 minutes and has always expressed more from the left than the right, but usually no more than 15 ml per pumping.  Mom has been using a Medela DEBP at home that she received from her [facility] insurance.  Information given to increase her milk supply including warmth, skin to skin, breast massage, hand expression, super pumping, power pumping, foods to eat and avoid, galactogogues, etc  Reviewed pumping, collection, storage, labeling and handling of expressed breast milk.  Mom is still willing to continue to put [name] to the breast and pump and bottlefeed.  Praised mom for her commitment to provide as much breast milk as she can for [name].  Lactation names and numbers written on white board and encouraged to call RN tonight and [facility] tomorrow with any questions, concerns or assistance. Maternal Data Formula Feeding for Exclusion: No Has patient been taught Hand Expression?: Yes Does the patient have breastfeeding  experience prior to this delivery?: No(Gr1)  Feeding Feeding Type: Breast Fed Nipple Type: Slow - flow  LATCH Score Latch: Repeated attempts needed to sustain latch, nipple held in mouth throughout feeding, stimulation needed to elicit sucking reflex.  Audible Swallowing: A few with stimulation  Type of Nipple: Everted at rest and after stimulation  Comfort (Breast/Nipple): Filling, red/small blisters or bruises, mild/mod discomfort  Hold (Positioning): No assistance needed to correctly position infant at breast.  LATCH Score: 7  Interventions Interventions: Breast feeding basics reviewed;Breast massage;Hand express;Reverse pressure;Breast compression;Support pillows;Expressed milk;DEBP  Lactation Tools Discussed/Used Tools: Pump;Bottle Breast pump type: Double-Electric Breast Pump WIC Program: No([employer] Insurance) Pump Review: Setup, frequency, and cleaning;Milk Storage;Other (comment) Initiated by:: 07/13/2019 Date initiated:: (Set up Symphony in room)   Consult Status Consult Status: Follow-up Date: 07/24/19 Follow-up type: Call as needed    Jarold Motto 07/23/2019, 11:00 PM

## 2019-07-24 ENCOUNTER — Ambulatory Visit: Payer: Self-pay

## 2019-07-24 NOTE — Lactation Note (Signed)
This note was copied from a baby's chart. Lactation Consultation Note  Patient Name: Beth Cox [id number] Date: 07/24/2019 Reason for consult: Follow-up assessment;NICU baby   Maternal Data    Feeding Feeding Type: Breast Fed Nipple Type: Slow - flow  LATCH Score Latch: Repeated attempts needed to sustain latch, nipple held in mouth throughout feeding, stimulation needed to elicit sucking reflex.  Audible Swallowing: A few with stimulation  Type of Nipple: Everted at rest and after stimulation  Comfort (Breast/Nipple): Soft / non-tender  Hold (Positioning): Assistance needed to correctly position infant at breast and maintain latch.  LATCH Score: 7  Interventions Interventions: Breast feeding basics reviewed;Assisted with latch;Support pillows  Lactation Tools Discussed/Used Tools: Nipple Shields Nipple shield size: 20   Consult Status Consult Status: Complete Follow-up type: Call as needed   SCN RN asked LC to assist with a feeding because baby is being transferred home today. LC walked in room and baby had just been given 57mL of formula from a bottle with a slow flow nipple. Parents stated that baby was very fussy and they couldn't get him to calm down and latch on. They also mentioned baby ate very fast.  LC noticed baby was still cueing so we discussed early infant feeding cues to assist parents with observing when to feed baby to prevent him from getting too fussy to latch on in the future. Once baby was calm and was still turning his head, LC asked if she could help position baby for MOB. Baby latched on, but came off because of lack of coordination (assuming transferring from mom's nipple from bottle nipple) so LC implemented 70mm nipple shield to help sustain latch.   LC reviewed: maintaining and increasing milk supply via pump and frequent breastfeeding sessions, positioning, clusterfeeding, feeding cues, and where and how to receive breastfeeding help. LC  strongly encouraged parents to schedule a f/u outpatient visit with Lactation in a week.   Marnee Spring 07/24/2019, 10:06 AM

## 2020-07-31 ENCOUNTER — Encounter: Payer: Self-pay | Admitting: Family Medicine

## 2020-07-31 ENCOUNTER — Other Ambulatory Visit: Payer: Self-pay

## 2020-07-31 ENCOUNTER — Ambulatory Visit: Payer: Self-pay | Admitting: Family Medicine

## 2020-07-31 VITALS — BP 122/82 | HR 68 | Temp 97.9°F | Resp 14 | Ht 68.0 in | Wt 177.7 lb

## 2020-07-31 DIAGNOSIS — Z13220 Encounter for screening for lipoid disorders: Secondary | ICD-10-CM

## 2020-07-31 DIAGNOSIS — Z1159 Encounter for screening for other viral diseases: Secondary | ICD-10-CM

## 2020-07-31 DIAGNOSIS — E663 Overweight: Secondary | ICD-10-CM

## 2020-07-31 DIAGNOSIS — E010 Iodine-deficiency related diffuse (endemic) goiter: Secondary | ICD-10-CM

## 2020-07-31 DIAGNOSIS — Z0000 Encounter for general adult medical examination without abnormal findings: Secondary | ICD-10-CM

## 2020-07-31 NOTE — Progress Notes (Signed)
Patient: Beth Cox, Female    DOB: 1986/07/22, 34 y.o.   MRN: [id number] Arnetha Courser, MD Visit Date: 07/31/2020  Today's Provider: [name], PA-C   Chief Complaint  Patient presents with  . Annual Exam    no pap   Subjective:   Annual physical exam:  Beth Cox is a 34 y.o. female who presents today for complete physical exam:  Exercise/Activity:  She exercises regularly - motivated to get back to prepregnancy weight - dance, steps, and walking 15,000 steps a day Diet/nutrition:  Improving, healthier foods and nutrition Sleep: sleeps good, baby is one, getting better sleep  Goal weight is 160  USPSTF grade A and B recommendations - reviewed and addressed today  Depression:  Phq 9 completed today by patient, was reviewed by me with patient in the room PHQ score is neg, pt feels mood good PHQ 2/9 Scores 07/31/2020 08/08/2018 12/13/2017 07/15/2016  PHQ - 2 Score 0 0 0 0  PHQ- 9 Score 0 - - -   Depression screen [facility] 2/9 07/31/2020 08/08/2018 12/13/2017 07/15/2016 09/23/2015  Decreased Interest 0 0 0 0 0  Down, Depressed, Hopeless 0 0 0 0 0  PHQ - 2 Score 0 0 0 0 0  Altered sleeping 0 - - - -  Tired, decreased energy 0 - - - -  Change in appetite 0 - - - -  Feeling bad or failure about yourself  0 - - - -  Trouble concentrating 0 - - - -  Moving slowly or fidgety/restless 0 - - - -  Suicidal thoughts 0 - - - -  PHQ-9 Score 0 - - - -  Difficult doing work/chores Not difficult at all - - - -    Alcohol screening:   Office Visit from 07/31/2020 in [facility]  AUDIT-C Score 0      Immunizations and Health Maintenance: Health Maintenance  Topic Date Due  . Hepatitis C Screening  Never done  . COVID-19 Vaccine (1) Never done  . INFLUENZA VACCINE  07/27/2020  . PAP SMEAR-Modifier  12/27/2021  . TETANUS/TDAP  04/15/2029  . HIV Screening  Completed     Hep C Screening: will due today  STD testing and prevention  (HIV/chl/gon/syphilis):  see above, no additional testing desired by pt today  Intimate partner violence:  none   Sexual History/Pain during Intercourse: Married - denies pain with sex  Menstrual History/LMP/Abnormal Bleeding:  Normal cycles  Patient's last menstrual period was 07/08/2020.  Incontinence Symptoms: prior to baby she has urinary frequency  Breast cancer: no CA in family Last Mammogram:  Screening not yet due, no genetic testing  Cervical cancer screening:  Up to date Pt  family hx of cancers - breast, ovarian, uterine, colon:     Osteoporosis:   Discussion on osteoporosis per age, including high calcium and vitamin D supplementation, weight bearing exercises  Skin cancer:  Hx of skin CA  Discussed atypical lesions   Colorectal cancer:   Colonoscopy - 27  Discussed concerning signs and sx of CRC, pt denies melena hematochezia or bowel changes  Lung cancer:   Low Dose CT Chest recommended if Age 16-80 years, 30 pack-year currently smoking OR have quit w/in 15years. Patient does not qualify.    Social History   Tobacco Use  . Smoking status: Never Smoker  . Smokeless tobacco: Never Used  Vaping Use  . Vaping Use: Never used  Substance Use Topics  .  Alcohol use: No    Alcohol/week: 0.0 standard drinks  . Drug use: No       Office Visit from 07/31/2020 in [facility]  AUDIT-C Score 0      Family History  Problem Relation Age of Onset  . Depression Mother   . Depression Father   . Breast cancer Neg Hx      Blood pressure/Hypertension: BP Readings from Last 3 Encounters:  07/31/20 122/82  07/14/19 106/67  10/05/18 102/75    Weight/Obesity: Wt Readings from Last 3 Encounters:  07/31/20 177 lb 11.2 oz (80.6 kg)  07/12/19 206 lb (93.4 kg)  10/05/18 163 lb 6.4 oz (74.1 kg)   BMI Readings from Last 3 Encounters:  07/31/20 27.02 kg/m  07/12/19 32.26 kg/m  10/05/18 25.21 kg/m     Lipids:  No results found for: CHOL No  results found for: HDL No results found for: LDLCALC No results found for: TRIG No results found for: CHOLHDL No results found for: LDLDIRECT Based on the results of lipid panel his/her cardiovascular risk factor ( using [place] )  in the next 10 years is: The ASCVD Risk score Mikey Bussing DC Jr., et al., 2013) failed to calculate for the following reasons:   The 2013 ASCVD risk score is only valid for ages 5 to 52 Glucose:  Glucose  Date Value Ref Range Status  07/03/2014 81 65 - 99 mg/dL Final   Glucose, Bld  Date Value Ref Range Status  12/13/2017 85 65 - 99 mg/dL Final    Comment:    .            Fasting reference interval .   07/15/2016 75 65 - 99 mg/dL Final  05/16/2012 73 70 - 99 mg/dL Final   Hypertension: BP Readings from Last 3 Encounters:  07/31/20 122/82  07/14/19 106/67  10/05/18 102/75   Obesity: Wt Readings from Last 3 Encounters:  07/31/20 177 lb 11.2 oz (80.6 kg)  07/12/19 206 lb (93.4 kg)  10/05/18 163 lb 6.4 oz (74.1 kg)   BMI Readings from Last 3 Encounters:  07/31/20 27.02 kg/m  07/12/19 32.26 kg/m  10/05/18 25.21 kg/m      Advanced Care Planning:  A voluntary discussion about advance care planning including the explanation and discussion of advance directives.   Discussed health care proxy and Living will, and the patient was able to identify a health care proxy as husband [name] Patient does not have a living will at present time.   Social History      She        Social History   Socioeconomic History  . Marital status: Married    Spouse name: Phillips Odor  . Number of children: Not on file  . Years of education: 8  . Highest education level: Not on file  Occupational History  . Occupation: Audiological scientist    Comment: [employer]  Tobacco Use  . Smoking status: Never Smoker  . Smokeless tobacco: Never Used  Vaping Use  . Vaping Use: Never used  Substance and Sexual Activity  . Alcohol use: No    Alcohol/week: 0.0  standard drinks  . Drug use: No  . Sexual activity: Yes    Partners: Male  Other Topics Concern  . Not on file  Social History Narrative  . Not on file   Social Determinants of Health   Financial Resource Strain:   . Difficulty of Paying Living Expenses:   Food Insecurity:   .  Worried About Charity fundraiser in the Last Year:   . Arboriculturist in the Last Year:   Transportation Needs:   . Film/video editor (Medical):   Marland Kitchen Lack of Transportation (Non-Medical):   Physical Activity:   . Days of Exercise per Week:   . Minutes of Exercise per Session:   Stress:   . Feeling of Stress :   Social Connections:   . Frequency of Communication with Friends and Family:   . Frequency of Social Gatherings with Friends and Family:   . Attends Religious Services:   . Active Member of Clubs or Organizations:   . Attends Archivist Meetings:   Marland Kitchen Marital Status:     Family History        Family History  Problem Relation Age of Onset  . Depression Mother   . Depression Father   . Breast cancer Neg Hx     Patient Active Problem List   Diagnosis Date Noted  . Labor and delivery indication for care or intervention 07/12/2019  . Urinary frequency 08/02/2016  . Breast lump on left side at 5 o'clock position 08/02/2016  . Breast tenderness in female 08/02/2016  . Positive ANA (antinuclear antibody) 07/22/2016  . Menstrual period late 07/15/2016  . LLQ abdominal pain 07/15/2016  . Peripheral vascular disease, unspecified ([place]) 07/15/2016  . Fibromyalgia syndrome 09/23/2015  . Chronic pelvic pain in female 09/23/2015  . Constipation, slow transit 09/23/2015  . Allergic rhinitis with postnasal drip 07/25/2015  . Other fatigue 07/25/2015  . Anxiety, mild 06/04/2015  . Migraine without aura and without status migrainosus, not intractable     Past Surgical History:  Procedure Laterality Date  . CESAREAN SECTION N/A 07/13/2019   Procedure: CESAREAN SECTION;  Surgeon:  Ward, Honor Loh, MD;  Location: [facility] ORS;  Service: Obstetrics;  Laterality: N/A;     Current Outpatient Medications:  .  Cholecalciferol (VITAMIN D3 GUMMIES) 25 MCG (1000 UT) CHEW, Chew by mouth., Disp: , Rfl:   Allergies  Allergen Reactions  . Shellfish Allergy Other (See Comments)  . Apple   . Peanuts  [Peanut Oil]     Other reaction(s): SHORTNESS OF BREATH  . Shellfish-Derived Products     Other reaction(s): Other (See Comments)    Patient Care Team: Lada, Satira Anis, MD as PCP - General (Family Medicine) Bobetta Lime, MD (Family Medicine)  Review of Systems  Constitutional: Negative.  Negative for activity change, appetite change, fatigue and unexpected weight change.  HENT: Negative.   Eyes: Negative.   Respiratory: Negative.  Negative for shortness of breath.   Cardiovascular: Negative.  Negative for chest pain, palpitations and leg swelling.  Gastrointestinal: Negative.  Negative for abdominal pain and blood in stool.  Endocrine: Negative.   Genitourinary: Positive for frequency (chronic urinary frequency).  Musculoskeletal: Negative.  Negative for arthralgias, gait problem, joint swelling and myalgias.  Skin: Negative.  Negative for color change, pallor and rash.  Allergic/Immunologic: Negative.   Neurological: Negative.  Negative for syncope and weakness.  Hematological: Negative.   Psychiatric/Behavioral: Negative.  Negative for confusion, dysphoric mood, self-injury and suicidal ideas. The patient is not nervous/anxious.   All other systems reviewed and are negative.      I personally reviewed active problem list, medication list, allergies, family history, social history, health maintenance, notes from last encounter, lab results, imaging with the patient/caregiver today.        Objective:   Vitals:  Vitals:   07/31/20  1042  BP: 122/82  Pulse: 68  Resp: 14  Temp: 97.9 F (36.6 C)  SpO2: 96%  Weight: 177 lb 11.2 oz (80.6 kg)  Height: 5\' 8"   (1.727 m)    Body mass index is 27.02 kg/m.  Physical Exam Vitals and nursing note reviewed.  Constitutional:      General: She is not in acute distress.    Appearance: Normal appearance. She is well-developed. She is not ill-appearing, toxic-appearing or diaphoretic.     Interventions: Face mask in place.  HENT:     Head: Normocephalic and atraumatic.     Right Ear: External ear normal.     Left Ear: External ear normal.  Eyes:     General: Lids are normal. No scleral icterus.       Right eye: No discharge.        Left eye: No discharge.     Conjunctiva/sclera: Conjunctivae normal.  Neck:     Thyroid: Thyromegaly present. No thyroid tenderness.     Trachea: Phonation normal. No tracheal deviation.  Cardiovascular:     Rate and Rhythm: Normal rate and regular rhythm.     Pulses: Normal pulses.          Radial pulses are 2+ on the right side and 2+ on the left side.       Posterior tibial pulses are 2+ on the right side and 2+ on the left side.     Heart sounds: Normal heart sounds. No murmur heard.  No friction rub. No gallop.   Pulmonary:     Effort: Pulmonary effort is normal. No respiratory distress.     Breath sounds: Normal breath sounds. No stridor. No wheezing, rhonchi or rales.  Chest:     Chest wall: No tenderness.  Abdominal:     General: Bowel sounds are normal. There is no distension.     Palpations: Abdomen is soft.     Tenderness: There is no abdominal tenderness. There is no guarding or rebound.  Musculoskeletal:        General: No deformity. Normal range of motion.     Cervical back: Normal range of motion and neck supple.     Right lower leg: No edema.     Left lower leg: No edema.  Lymphadenopathy:     Cervical: No cervical adenopathy.  Skin:    General: Skin is warm and dry.     Capillary Refill: Capillary refill takes less than 2 seconds.     Coloration: Skin is not jaundiced or pale.     Findings: No rash.  Neurological:     Mental Status: She  is alert and oriented to person, place, and time.     Motor: No abnormal muscle tone.     Gait: Gait normal.  Psychiatric:        Speech: Speech normal.        Behavior: Behavior normal.       Fall Risk: Fall Risk  07/31/2020 08/08/2018 12/13/2017 07/15/2016 09/23/2015  Falls in the past year? 0 No No No No  Number falls in past yr: 0 - - - -  Injury with Fall? 0 - - - -    Functional Status Survey: Is the patient deaf or have difficulty hearing?: No Does the patient have difficulty seeing, even when wearing glasses/contacts?: No Does the patient have difficulty concentrating, remembering, or making decisions?: No Does the patient have difficulty walking or climbing stairs?: No Does the patient have difficulty dressing  or bathing?: No Does the patient have difficulty doing errands alone such as visiting a doctor's office or shopping?: No   Assessment & Plan:    CPE completed today  . USPSTF grade A and B recommendations reviewed with patient; age-appropriate recommendations, preventive care, screening tests, etc discussed and encouraged; healthy living encouraged; see AVS for patient education given to patient  . Discussed importance of 150 minutes of physical activity weekly, AHA exercise recommendations given to pt in AVS/handout  . Discussed importance of healthy diet:  eating lean meats and proteins, avoiding trans fats and saturated fats, avoid simple sugars and excessive carbs in diet, eat 6 servings of fruit/vegetables daily and drink plenty of water and avoid sweet beverages.    . Recommended pt to do annual eye exam and routine dental exams/cleanings  . Depression, alcohol, fall screening completed as documented above and per flowsheets  . Reviewed Health Maintenance: Health Maintenance  Topic Date Due  . Hepatitis C Screening  Never done  . COVID-19 Vaccine (1) Never done  . INFLUENZA VACCINE  07/27/2020  . PAP SMEAR-Modifier  12/27/2021  . TETANUS/TDAP   04/15/2029  . HIV Screening  Completed        ICD-10-CM   1. Annual physical exam  Z00.00 Lipid panel    CBC w/Diff/Platelet    COMPLETE METABOLIC PANEL WITH GFR  2. Encounter for hepatitis C screening test for low risk patient  Z11.59 Hepatitis C Antibody  3. Thyromegaly  E01.0 CBC w/Diff/Platelet    COMPLETE METABOLIC PANEL WITH GFR    TSH    T4, free    US THYROID   no current pain, some vague swallowing fullness sx, check labs and [place]  4. Lipid screening  Z13.220 Lipid panel    COMPLETE METABOLIC PANEL WITH GFR  5. Overweight  E66.3 Lipid panel    COMPLETE METABOLIC PANEL WITH GFR       [name], PA-C 07/31/20 11:08 AM  [facility]

## 2020-07-31 NOTE — Patient Instructions (Signed)
Preventive Care 21-34 Years Old, Female Preventive care refers to visits with your health care provider and lifestyle choices that can promote health and wellness. This includes:  A yearly physical exam. This may also be called an annual well check.  Regular dental visits and eye exams.  Immunizations.  Screening for certain conditions.  Healthy lifestyle choices, such as eating a healthy diet, getting regular exercise, not using drugs or products that contain nicotine and tobacco, and limiting alcohol use. What can I expect for my preventive care visit? Physical exam Your health care provider will check your:  Height and weight. This may be used to calculate body mass index (BMI), which tells if you are at a healthy weight.  Heart rate and blood pressure.  Skin for abnormal spots. Counseling Your health care provider may ask you questions about your:  Alcohol, tobacco, and drug use.  Emotional well-being.  Home and relationship well-being.  Sexual activity.  Eating habits.  Work and work environment.  Method of birth control.  Menstrual cycle.  Pregnancy history. What immunizations do I need?  Influenza (flu) vaccine  This is recommended every year. Tetanus, diphtheria, and pertussis (Tdap) vaccine  You may need a Td booster every 10 years. Varicella (chickenpox) vaccine  You may need this if you have not been vaccinated. Human papillomavirus (HPV) vaccine  If recommended by your health care provider, you may need three doses over 6 months. Measles, mumps, and rubella (MMR) vaccine  You may need at least one dose of MMR. You may also need a second dose. Meningococcal conjugate (MenACWY) vaccine  One dose is recommended if you are age 19-21 years and a first-year college student living in a residence hall, or if you have one of several medical conditions. You may also need additional booster doses. Pneumococcal conjugate (PCV13) vaccine  You may need  this if you have certain conditions and were not previously vaccinated. Pneumococcal polysaccharide (PPSV23) vaccine  You may need one or two doses if you smoke cigarettes or if you have certain conditions. Hepatitis A vaccine  You may need this if you have certain conditions or if you travel or work in places where you may be exposed to hepatitis A. Hepatitis B vaccine  You may need this if you have certain conditions or if you travel or work in places where you may be exposed to hepatitis B. Haemophilus influenzae type b (Hib) vaccine  You may need this if you have certain conditions. You may receive vaccines as individual doses or as more than one vaccine together in one shot (combination vaccines). Talk with your health care provider about the risks and benefits of combination vaccines. What tests do I need?  Blood tests  Lipid and cholesterol levels. These may be checked every 5 years starting at age 20.  Hepatitis C test.  Hepatitis B test. Screening  Diabetes screening. This is done by checking your blood sugar (glucose) after you have not eaten for a while (fasting).  Sexually transmitted disease (STD) testing.  BRCA-related cancer screening. This may be done if you have a family history of breast, ovarian, tubal, or peritoneal cancers.  Pelvic exam and Pap test. This may be done every 3 years starting at age 21. Starting at age 30, this may be done every 5 years if you have a Pap test in combination with an HPV test. Talk with your health care provider about your test results, treatment options, and if necessary, the need for more tests.   Follow these instructions at home: Eating and drinking   Eat a diet that includes fresh fruits and vegetables, whole grains, lean protein, and low-fat dairy.  Take vitamin and mineral supplements as recommended by your health care provider.  Do not drink alcohol if: ? Your health care provider tells you not to drink. ? You are  pregnant, may be pregnant, or are planning to become pregnant.  If you drink alcohol: ? Limit how much you have to 0-1 drink a day. ? Be aware of how much alcohol is in your drink. In the [place] one drink equals one 12 oz bottle of beer (355 mL), one 5 oz glass of wine (148 mL), or one 1 oz glass of hard liquor (44 mL). Lifestyle  Take daily care of your teeth and gums.  Stay active. Exercise for at least 30 minutes on 5 or more days each week.  Do not use any products that contain nicotine or tobacco, such as cigarettes, e-cigarettes, and chewing tobacco. If you need help quitting, ask your health care provider.  If you are sexually active, practice safe sex. Use a condom or other form of birth control (contraception) in order to prevent pregnancy and STIs (sexually transmitted infections). If you plan to become pregnant, see your health care provider for a preconception visit. What's next?  Visit your health care provider once a year for a well check visit.  Ask your health care provider how often you should have your eyes and teeth checked.  Stay up to date on all vaccines. This information is not intended to replace advice given to you by your health care provider. Make sure you discuss any questions you have with your health care provider. Document Revised: 08/24/2018 Document Reviewed: 08/24/2018 Elsevier Patient Education  2020 [employer].

## 2020-08-01 LAB — COMPLETE METABOLIC PANEL WITH GFR
AG Ratio: 1.3 (calc) (ref 1.0–2.5)
ALT: 11 U/L (ref 6–29)
AST: 18 U/L (ref 10–30)
Albumin: 4.3 g/dL (ref 3.6–5.1)
Alkaline phosphatase (APISO): 62 U/L (ref 31–125)
BUN: 10 mg/dL (ref 7–25)
CO2: 27 mmol/L (ref 20–32)
Calcium: 9.9 mg/dL (ref 8.6–10.2)
Chloride: 107 mmol/L (ref 98–110)
Creat: 0.92 mg/dL (ref 0.50–1.10)
GFR, Est African American: 95 mL/min/{1.73_m2} (ref >=60)
GFR, Est Non African American: 82 mL/min/{1.73_m2} (ref >=60)
Globulin: 3.4 g/dL (calc) (ref 1.9–3.7)
Glucose, Bld: 72 mg/dL (ref 65–99)
Potassium: 5.3 mmol/L (ref 3.5–5.3)
Sodium: 140 mmol/L (ref 135–146)
Total Bilirubin: 0.4 mg/dL (ref 0.2–1.2)
Total Protein: 7.7 g/dL (ref 6.1–8.1)

## 2020-08-01 LAB — CBC WITH DIFFERENTIAL/PLATELET
Absolute Monocytes: 345 cells/uL (ref 200–950)
Basophils Absolute: 32 cells/uL (ref 0–200)
Basophils Relative: 0.6 %
Eosinophils Absolute: 53 cells/uL (ref 15–500)
Eosinophils Relative: 1 %
HCT: 36.2 % (ref 35.0–45.0)
Hemoglobin: 11.8 g/dL (ref 11.7–15.5)
Lymphs Abs: 1966 cells/uL (ref 850–3900)
MCH: 28.5 pg (ref 27.0–33.0)
MCHC: 32.6 g/dL (ref 32.0–36.0)
MCV: 87.4 fL (ref 80.0–100.0)
MPV: 12.3 fL (ref 7.5–12.5)
Monocytes Relative: 6.5 %
Neutro Abs: 2904 cells/uL (ref 1500–7800)
Neutrophils Relative %: 54.8 %
Platelets: 295 10*3/uL (ref 140–400)
RBC: 4.14 10*6/uL (ref 3.80–5.10)
RDW: 13.7 % (ref 11.0–15.0)
Total Lymphocyte: 37.1 %
WBC: 5.3 10*3/uL (ref 3.8–10.8)

## 2020-08-01 LAB — LIPID PANEL
Cholesterol: 171 mg/dL (ref <200)
HDL: 67 mg/dL (ref >=50)
LDL Cholesterol (Calc): 90 mg/dL (calc)
Non-HDL Cholesterol (Calc): 104 mg/dL (calc) (ref <130)
Total CHOL/HDL Ratio: 2.6 (calc) (ref <5.0)
Triglycerides: 46 mg/dL (ref <150)

## 2020-08-01 LAB — TSH: TSH: 0.62 mIU/L

## 2020-08-01 LAB — HEPATITIS C ANTIBODY
Hepatitis C Ab: NONREACTIVE
SIGNAL TO CUT-OFF: 0.01 (ref <1.00)

## 2020-08-01 LAB — T4, FREE: Free T4: 1.2 ng/dL (ref 0.8–1.8)

## 2020-08-04 ENCOUNTER — Other Ambulatory Visit: Payer: Self-pay

## 2020-08-04 ENCOUNTER — Ambulatory Visit
Admission: RE | Admit: 2020-08-04 | Discharge: 2020-08-04 | Disposition: A | Discharge status: Home / Self Care | Payer: BC Managed Care – PPO | Source: Ambulatory Visit | Attending: Family Medicine | Admitting: Family Medicine

## 2020-08-04 DIAGNOSIS — E042 Nontoxic multinodular goiter: Secondary | ICD-10-CM | POA: Diagnosis not present

## 2020-08-04 DIAGNOSIS — E010 Iodine-deficiency related diffuse (endemic) goiter: Secondary | ICD-10-CM

## 2020-08-05 ENCOUNTER — Encounter: Payer: Self-pay | Admitting: Family Medicine

## 2020-08-05 DIAGNOSIS — E042 Nontoxic multinodular goiter: Secondary | ICD-10-CM | POA: Insufficient documentation

## 2020-08-05 HISTORY — DX: Nontoxic multinodular goiter: E04.2

## 2020-08-05 NOTE — Progress Notes (Signed)
Beth Cox  I am writing to let you know that I have reviewed your thyroid ultrasound results:  It shows multinodular goiter  -  Goiter = enlarged thyroid gland, and multinodular means many small nodules/lumps that make up the thyroid tissue.  There were no concerning nodules, nothing needed a biopsy or other testing at this time. Your thyroid labs were normal - so that is suggestive of the thyroid tissue being enlarged but not actively or abnormally making too much or too little thyroid hormones.    They recommend rechecking in one year with a repeated ultrasound.  If there are no changes over time and you have no symptoms (either hormonal or due to size of goiter) then sometimes you get to stop checking on it.  Since you noted some vague neck symptoms, please keep careful watch on that, and notify me ASAP if you have any change to your voice, difficulty swallowing, speaking or breathing, or if you notice it getting enlarged, tender or nodular (any bigger noticeable bumps). Please also follow up if you develops any of the following symptoms: sudden and unexplained weight loss. rapid heartbeat. increased appetite. nervousness or anxiety. tremors, usually in your hands. sweating. increased sensitivity to heat.     Thank you so much, [name], PA-C

## 2020-11-04 ENCOUNTER — Encounter: Payer: Self-pay | Admitting: Family Medicine

## 2021-05-18 IMAGING — DX ABDOMEN - 1 VIEW
2 series · 2 of 2 positions shown · non-contrast
Comparison: Prior radiograph from 07/13/2018

CLINICAL DATA: Initial evaluation for sponge count, C-section.

EXAM:
ABDOMEN - 1 VIEW

[abdomen supine (1 of 2)]
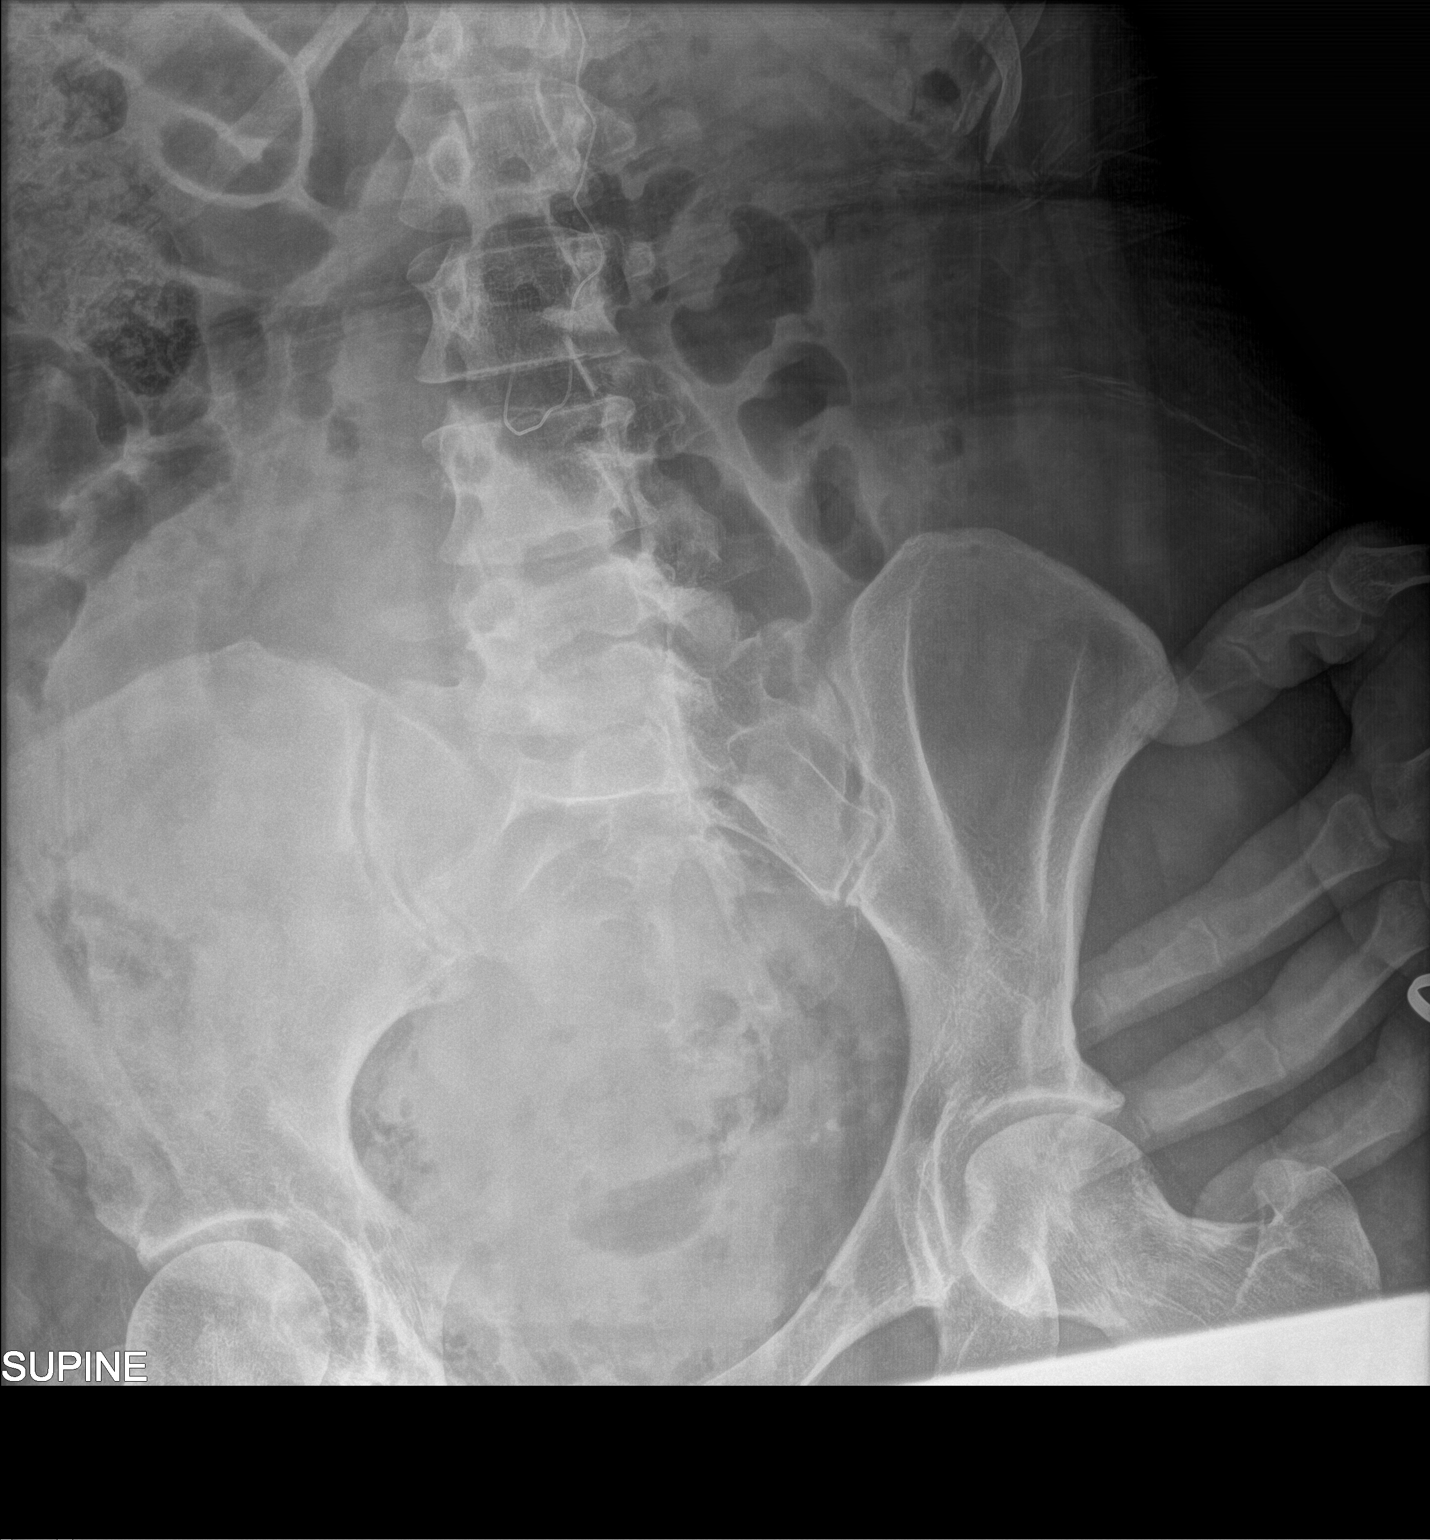

[abdomen supine (2 of 2)]
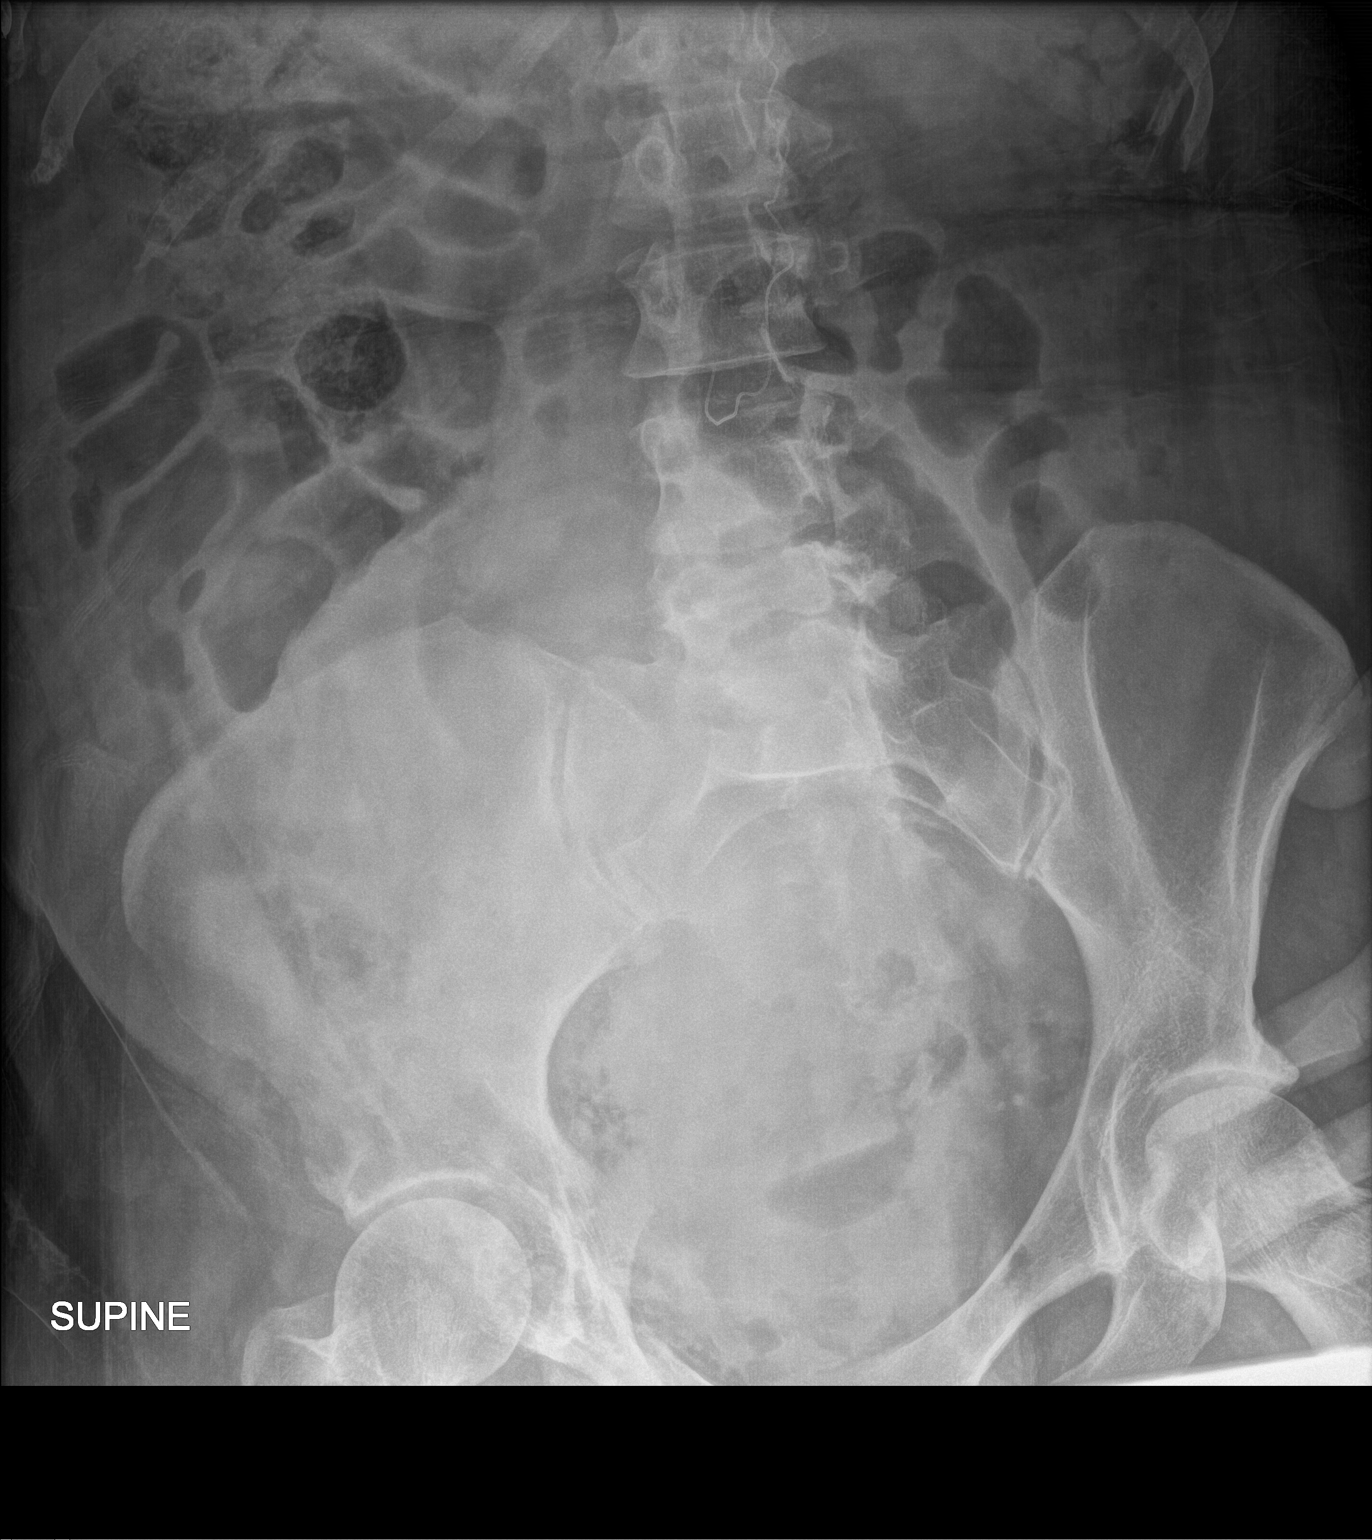

[2 of 2 positions shown; findings below may reference images not displayed]

FINDINGS: Serpiginous radiopaque density overlying the mid abdomen, overlying
the left lateral aspect of the L1 through L3 vertebral bodies,
indeterminate, but could reflect at retained foreign body/sponge.
Superior aspect not visualized. No other radiopaque foreign body
seen within the visualized abdomen or pelvis. Visualized bowel gas
pattern within normal limits. Visualized osseous structures
demonstrate no acute finding.
IMPRESSION: Serpiginous radiopaque density overlying the left lateral aspect of
the L1 through L3 vertebral bodies, indeterminate, but could reflect
a retained foreign body. Alternatively, finding could reflect an
epidural catheter external to the patient. Correlation with history
and physical exam recommended.

Findings discussed by telephone at the time of interpretation on
07/13/2019 at [DATE] with Dr. AHMEDH MASHEEDH SHINAAH.

## 2021-07-31 ENCOUNTER — Other Ambulatory Visit: Payer: Self-pay

## 2021-07-31 ENCOUNTER — Encounter: Payer: Self-pay | Admitting: Family Medicine

## 2021-07-31 ENCOUNTER — Ambulatory Visit (INDEPENDENT_AMBULATORY_CARE_PROVIDER_SITE_OTHER): Payer: BC Managed Care – PPO | Admitting: Family Medicine

## 2021-07-31 VITALS — BP 118/72 | HR 93 | Temp 98.4°F | Resp 16 | Ht 68.0 in | Wt 166.0 lb

## 2021-07-31 DIAGNOSIS — Z0000 Encounter for general adult medical examination without abnormal findings: Secondary | ICD-10-CM

## 2021-07-31 DIAGNOSIS — N6320 Unspecified lump in the left breast, unspecified quadrant: Secondary | ICD-10-CM

## 2021-07-31 DIAGNOSIS — T148XXA Other injury of unspecified body region, initial encounter: Secondary | ICD-10-CM

## 2021-07-31 DIAGNOSIS — E042 Nontoxic multinodular goiter: Secondary | ICD-10-CM

## 2021-07-31 NOTE — Progress Notes (Signed)
Patient: Beth Cox, Female    DOB: 09-Aug-1986, 35 y.o.   MRN: [id number] [name], PA-C Visit Date: 07/31/2021  Today's Provider: [name], PA-C   Chief Complaint  Patient presents with   Annual Exam   Subjective:   Annual physical exam:  Beth Cox is a 35 y.o. female who presents today for complete physical exam:  Exercise/Activity:  4-5x a week, dancing - She exercises regularly - motivated to get back to prepregnancy weight - dance, steps, and walking 15,000 steps a day Diet/nutrition:   reducing caffeine - Improving, healthier foods and nutrition Sleep:  sleeps well - sleeps better with reduced caffeine   Pt wished to do routine f/up on chronic conditions today in addition to CPE. Advised pt of separate visit billing/coding  Goiter and need for repeat thyroid US  Dr. Primitivo Gauze  Bruise and right leg pain, recent couple weeks fibromyalgia flare  USPSTF grade A and B recommendations - reviewed and addressed today  Depression:  Phq 9 completed today by patient, was reviewed by me with patient in the room PHQ score is neg, pt feels good PHQ 2/9 Scores 07/31/2021 07/31/2020 08/08/2018 12/13/2017  PHQ - 2 Score 0 0 0 0  PHQ- 9 Score 0 0 - -   Depression screen [facility] 2/9 07/31/2021 07/31/2020 08/08/2018 12/13/2017 07/15/2016  Decreased Interest 0 0 0 0 0  Down, Depressed, Hopeless 0 0 0 0 0  PHQ - 2 Score 0 0 0 0 0  Altered sleeping 0 0 - - -  Tired, decreased energy 0 0 - - -  Change in appetite 0 0 - - -  Feeling bad or failure about yourself  0 0 - - -  Trouble concentrating 0 0 - - -  Moving slowly or fidgety/restless 0 0 - - -  Suicidal thoughts 0 0 - - -  PHQ-9 Score 0 0 - - -  Difficult doing work/chores Not difficult at all Not difficult at all - - -    Alcohol screening: [place] Office Visit from 07/31/2020 in [facility]  AUDIT-C Score 0       Immunizations and Health Maintenance: Health Maintenance  Topic  Date Due   COVID-19 Vaccine (1) 08/16/2021 (Originally 08/09/1991)   INFLUENZA VACCINE  09/28/2021 (Originally 07/27/2021)   PAP SMEAR-Modifier  12/27/2021   TETANUS/TDAP  04/15/2029   Hepatitis C Screening  Completed   HIV Screening  Completed   Pneumococcal Vaccine 52-13 Years old  Aged Out   HPV VACCINES  Aged Out     Hep C Screening: done  STD testing and prevention (HIV/chl/gon/syphilis):  see above, no additional testing desired by pt today - none  Intimate partner violence:  safe   Sexual History/Pain during Intercourse: Married - some discomfort with sex sometimes when she feels like she may have an ovarian cyst  Menstrual History/LMP/Abnormal Bleeding:  irregular menses - not on OCP Patient's last menstrual period was 07/27/2021 (exact date).  Incontinence Symptoms: none  Breast cancer:  not yet indicated per age and family hx Last Mammogram: *see HM list above BRCA gene screening:    Cervical cancer screening: due next year Pt denies family hx of cancers - breast, ovarian, uterine, colon:     Osteoporosis:   Discussion on osteoporosis per age, including high calcium and vitamin D supplementation, weight bearing exercises  Skin cancer:  Hx of skin CA -  NO Concern with bruise to her right thigh,  no other lesions or moles Discussed atypical lesions   Colorectal cancer:   Colonoscopy is not due yet per age, current hx and family hx Discussed concerning signs and sx of CRC, pt denies blood in stool/melena abd pain, change in bowels  Lung cancer:   Low Dose CT Chest recommended if Age 78-80 years, 30 pack-year currently smoking OR have quit w/in 15years. Patient does not qualify.    Social History   Tobacco Use   Smoking status: Never   Smokeless tobacco: Never  Vaping Use   Vaping Use: Never used  Substance Use Topics   Alcohol use: No    Alcohol/week: 0.0 standard drinks   Drug use: No     Flowsheet Row Office Visit from 07/31/[facility] in [facility]  AUDIT-C Score 0       Family History  Problem Relation Age of Onset   Depression Mother    Depression Father    Cancer Paternal Uncle        lung CA   Cancer Paternal Grandfather        lung and possibly colon cancer   Breast cancer Neg Hx      Blood pressure/Hypertension: BP Readings from Last 3 Encounters:  07/31/21 118/72  07/31/20 122/82  07/14/19 106/67    Weight/Obesity: Wt Readings from Last 3 Encounters:  07/31/21 166 lb (75.3 kg)  07/31/20 177 lb 11.2 oz (80.6 kg)  07/12/19 206 lb (93.4 kg)   BMI Readings from Last 3 Encounters:  07/31/21 25.24 kg/m  07/31/20 27.02 kg/m  07/12/19 32.26 kg/m     Lipids:  Lab Results  Component Value Date   CHOL 171 07/31/2020   Lab Results  Component Value Date   HDL 67 07/31/2020   Lab Results  Component Value Date   LDLCALC 90 07/31/2020   Lab Results  Component Value Date   TRIG 46 07/31/2020   Lab Results  Component Value Date   CHOLHDL 2.6 07/31/2020   No results found for: LDLDIRECT Based on the results of lipid panel his/her cardiovascular risk factor ( using [place] )  in the next 10 years is: The ASCVD Risk score Mikey Bussing DC Jr., et al., 2013) failed to calculate for the following reasons:   The 2013 ASCVD risk score is only valid for ages 81 to 38 Glucose:  Glucose  Date Value Ref Range Status  07/03/2014 81 65 - 99 mg/dL Final   Glucose, Bld  Date Value Ref Range Status  07/31/2020 72 65 - 99 mg/dL Final    Comment:    .            Fasting reference interval .   12/13/2017 85 65 - 99 mg/dL Final    Comment:    .            Fasting reference interval .   07/15/2016 75 65 - 99 mg/dL Final    Advanced Care Planning:  A voluntary discussion about advance care planning including the explanation and discussion of advance directives.   Discussed health care proxy and Living will, and the patient was able to identify a health care proxy as husband .   Patient does  not have a living will at present time.   Social History      She        Social History   Socioeconomic History   Marital status: Married    Spouse name: Phillips Odor   Number of children: Not  on file   Years of education: 18   Highest education level: Not on file  Occupational History   Occupation: Audiological scientist    Comment: [employer]  Tobacco Use   Smoking status: Never   Smokeless tobacco: Never  Vaping Use   Vaping Use: Never used  Substance and Sexual Activity   Alcohol use: No    Alcohol/week: 0.0 standard drinks   Drug use: No   Sexual activity: Yes    Partners: Male  Other Topics Concern   Not on file  Social History Narrative   Not on file   Social Determinants of Health   Financial Resource Strain: Low Risk    Difficulty of Paying Living Expenses: Not hard at all  Food Insecurity: No Food Insecurity   Worried About Charity fundraiser in the Last Year: Never true   [place] in the Last Year: Never true  Transportation Needs: No Transportation Needs   Lack of Transportation (Medical): No   Lack of Transportation (Non-Medical): No  Physical Activity: Sufficiently Active   Days of Exercise per Week: 5 days   Minutes of Exercise per Session: 50 min  Stress: No Stress Concern Present   Feeling of Stress : Only a little  Social Connections: Engineer, building services of Communication with Friends and Family: More than three times a week   Frequency of Social Gatherings with Friends and Family: More than three times a week   Attends Religious Services: 1 to 4 times per year   Active Member of [employer] or Organizations: Yes   Attends Music therapist: More than 4 times per year   Marital Status: Married    Family History        Family History  Problem Relation Age of Onset   Depression Mother    Depression Father    Cancer Paternal Uncle        lung CA   Cancer Paternal Grandfather        lung and possibly colon cancer    Breast cancer Neg Hx     Patient Active Problem List   Diagnosis Date Noted   Multinodular goiter 08/05/2020   Urinary frequency 08/02/2016   Positive ANA (antinuclear antibody) 07/22/2016   Fibromyalgia syndrome 09/23/2015   Chronic pelvic pain in female 09/23/2015   Allergic rhinitis with postnasal drip 07/25/2015   Migraine without aura and without status migrainosus, not intractable     Past Surgical History:  Procedure Laterality Date   CESAREAN SECTION N/A 07/13/2019   Procedure: CESAREAN SECTION;  Surgeon: Ward, Honor Loh, MD;  Location: [facility] ORS;  Service: Obstetrics;  Laterality: N/A;    No current outpatient medications on file.  Allergies  Allergen Reactions   Shellfish Allergy Other (See Comments)   Apple    Peanuts  [Peanut Oil]     Other reaction(s): SHORTNESS OF BREATH   Shellfish-Derived Products     Other reaction(s): Other (See Comments)    Patient Care Team: [name], PA-C as PCP - General (Family Medicine) Bobetta Lime, MD (Family Medicine)  Review of Systems  Constitutional: Negative.  Negative for activity change, appetite change, fatigue and unexpected weight change.  HENT: Negative.    Eyes: Negative.   Respiratory: Negative.  Negative for shortness of breath.   Cardiovascular: Negative.  Negative for chest pain, palpitations and leg swelling.  Gastrointestinal: Negative.  Negative for abdominal pain and blood in stool.  Endocrine: Negative.   Genitourinary:  Negative.   Musculoskeletal: Negative.  Negative for arthralgias, gait problem, joint swelling and myalgias.  Skin: Negative.  Negative for pallor and rash.  Allergic/Immunologic: Negative.   Neurological: Negative.  Negative for syncope and weakness.  Hematological: Negative.   Psychiatric/Behavioral: Negative.  Negative for dysphoric mood, self-injury and suicidal ideas. The patient is not nervous/anxious.   All other systems reviewed and are negative.     Chart Review: I  personally reviewed active problem list, medication list, allergies, family history, social history, health maintenance, notes from last encounter, lab results, imaging with the patient/caregiver today.        Objective:   Vitals:  Vitals:   07/31/21 0907  BP: 118/72  Pulse: 93  Resp: 16  Temp: 98.4 F (36.9 C)  SpO2: 99%  Weight: 166 lb (75.3 kg)  Height: _0  (1.727 m)    Body mass index is 25.24 kg/m.  Physical Exam Vitals and nursing note reviewed.  Constitutional:      General: She is not in acute distress.    Appearance: Normal appearance. She is well-developed and normal weight. She is not ill-appearing, toxic-appearing or diaphoretic.     Interventions: Face mask in place.  HENT:     Head: Normocephalic and atraumatic.     Right Ear: External ear normal.     Left Ear: External ear normal.  Eyes:     General: Lids are normal. No scleral icterus.       Right eye: No discharge.        Left eye: No discharge.     Conjunctiva/sclera: Conjunctivae normal.     Pupils: Pupils are equal, round, and reactive to light.  Neck:     Thyroid: Thyromegaly present. No thyroid mass or thyroid tenderness.     Trachea: Trachea and phonation normal. No tracheal deviation.  Cardiovascular:     Rate and Rhythm: Normal rate and regular rhythm.     Pulses: Normal pulses.          Radial pulses are 2+ on the right side and 2+ on the left side.       Posterior tibial pulses are 2+ on the right side and 2+ on the left side.     Heart sounds: Normal heart sounds. No murmur heard.   No friction rub. No gallop.  Pulmonary:     Effort: Pulmonary effort is normal. No respiratory distress.     Breath sounds: Normal breath sounds. No stridor. No wheezing, rhonchi or rales.  Chest:     Chest wall: No mass, deformity, swelling or tenderness.  Breasts:    Right: No swelling, bleeding, inverted nipple, mass, nipple discharge, skin change, tenderness, axillary adenopathy or supraclavicular  adenopathy.     Left: No swelling, bleeding, inverted nipple, mass, nipple discharge, skin change, tenderness, axillary adenopathy or supraclavicular adenopathy.     Comments: Slightly more dense left breast tissue but no noted masses or gross asymmetry Abdominal:     General: Bowel sounds are normal. There is no distension.     Palpations: Abdomen is soft.     Tenderness: There is no abdominal tenderness. There is no guarding or rebound.  Musculoskeletal:     Cervical back: Normal range of motion and neck supple. No tenderness.     Right lower leg: No edema.     Left lower leg: No edema.  Lymphadenopathy:     Cervical: No cervical adenopathy.     Upper Body:     Right upper  body: No supraclavicular, axillary or pectoral adenopathy.     Left upper body: No supraclavicular, axillary or pectoral adenopathy.  Skin:    General: Skin is warm and dry.     Coloration: Skin is not jaundiced or pale.     Findings: Bruising present. No rash.     Comments: Right thigh healing bruise roughly 4cm in diameter  Neurological:     Mental Status: She is alert.     Motor: No abnormal muscle tone.     Gait: Gait normal.  Psychiatric:        Mood and Affect: Mood normal.        Speech: Speech normal.        Behavior: Behavior normal.      Fall Risk: Fall Risk  07/31/2021 07/31/2020 08/08/2018 12/13/2017 07/15/2016  Falls in the past year? 1 0 No No No  Number falls in past yr: 0 0 - - -  Injury with Fall? 0 0 - - -    Functional Status Survey: Is the patient deaf or have difficulty hearing?: No Does the patient have difficulty seeing, even when wearing glasses/contacts?: No Does the patient have difficulty concentrating, remembering, or making decisions?: No Does the patient have difficulty walking or climbing stairs?: No Does the patient have difficulty dressing or bathing?: No Does the patient have difficulty doing errands alone such as visiting a doctor's office or shopping?: No   Assessment  & Plan:    CPE completed today  USPSTF grade A and B recommendations reviewed with patient; age-appropriate recommendations, preventive care, screening tests, etc discussed and encouraged; healthy living encouraged; see AVS for patient education given to patient  Discussed importance of 150 minutes of physical activity weekly, AHA exercise recommendations given to pt in AVS/handout  Discussed importance of healthy diet:  eating lean meats and proteins, avoiding trans fats and saturated fats, avoid simple sugars and excessive carbs in diet, eat 6 servings of fruit/vegetables daily and drink plenty of water and avoid sweet beverages.    Recommended pt to do annual eye exam and routine dental exams/cleanings  Depression, alcohol, fall screening completed as documented above and per flowsheets  Reviewed Health Maintenance: Health Maintenance  Topic Date Due   COVID-19 Vaccine (1) 08/16/2021 (Originally 08/09/1991)   INFLUENZA VACCINE  09/28/2021 (Originally 07/27/2021)   PAP SMEAR-Modifier  12/27/2021   TETANUS/TDAP  04/15/2029   Hepatitis C Screening  Completed   HIV Screening  Completed   Pneumococcal Vaccine 59-56 Years old  Aged Out   HPV VACCINES  Aged Out    Immunizations: Immunization History  Administered Date(s) Administered   Tdap 04/16/2019      ICD-10-CM   1. Annual physical exam  Z00.00 TSH    COMPLETE METABOLIC PANEL WITH GFR    CBC w/Diff/Platelet    2. Multinodular goiter  E04.2 TSH    T4, free    US THYROID   some swallowing sx intermittently when laying down, no change over years, due for f/up imaging and labs, no sx of thyroid dysfunction    3. Left breast mass  N63.20 MM Digital Diagnostic Bilat    US BREAST COMPLETE UNI LEFT INC AXILLA   reviewed prior imaging 2017, lost to f/up, breast exam done today, no mass, imaging ordered for f/up    4. Bruise  T14.8XXA    right lateral thigh, slight hematoma, non-tender, appears normal and starting to heal,  reassured pt  [name], PA-C 07/31/21 9:24 AM  [facility]

## 2021-07-31 NOTE — Patient Instructions (Addendum)
Call for mammogram and [place] follow up on breast mass - fibroadenoma [facility] [place],  [place]  [id number] Get Driving Directions Main: [phone]  Preventive Care 69-35 Years Old, Female Preventive care refers to lifestyle choices and visits with your health care provider that can promote health and wellness. This includes: A yearly physical exam. This is also called an annual wellness visit. Regular dental and eye exams. Immunizations. Screening for certain conditions. Healthy lifestyle choices, such as: Eating a healthy diet. Getting regular exercise. Not using drugs or products that contain nicotine and tobacco. Limiting alcohol use. What can I expect for my preventive care visit? Physical exam Your health care provider may check your: Height and weight. These may be used to calculate your BMI (body mass index). BMI is a measurement that tells if you are at a healthy weight. Heart rate and blood pressure. Body temperature. Skin for abnormal spots. Counseling Your health care provider may ask you questions about your: Past medical problems. Family's medical history. Alcohol, tobacco, and drug use. Emotional well-being. Home life and relationship well-being. Sexual activity. Diet, exercise, and sleep habits. Work and work Statistician. Access to firearms. Method of birth control. Menstrual cycle. Pregnancy history. What immunizations do I need?  Vaccines are usually given at various ages, according to a schedule. Your health care provider will recommend vaccines for you based on your age, medicalhistory, and lifestyle or other factors, such as travel or where you work. What tests do I need?  Blood tests Lipid and cholesterol levels. These may be checked every 5 years starting at age 76. Hepatitis C test. Hepatitis B test. Screening Diabetes screening. This is done by checking your blood sugar (glucose) after you  have not eaten for a while (fasting). STD (sexually transmitted disease) testing, if you are at risk. BRCA-related cancer screening. This may be done if you have a family history of breast, ovarian, tubal, or peritoneal cancers. Pelvic exam and Pap test. This may be done every 3 years starting at age 38. Starting at age 37, this may be done every 5 years if you have a Pap test in combination with an HPV test. Talk with your health care provider about your test results, treatment options,and if necessary, the need for more tests. Follow these instructions at home: Eating and drinking  Eat a healthy diet that includes fresh fruits and vegetables, whole grains, lean protein, and low-fat dairy products. Take vitamin and mineral supplements as recommended by your health care provider. Do not drink alcohol if: Your health care provider tells you not to drink. You are pregnant, may be pregnant, or are planning to become pregnant. If you drink alcohol: Limit how much you have to 0-1 drink a day. Be aware of how much alcohol is in your drink. In the [place] one drink equals one 12 oz bottle of beer (355 mL), one 5 oz glass of wine (148 mL), or one 1 oz glass of hard liquor (44 mL).  Lifestyle Take daily care of your teeth and gums. Brush your teeth every morning and night with fluoride toothpaste. Floss one time each day. Stay active. Exercise for at least 30 minutes 5 or more days each week. Do not use any products that contain nicotine or tobacco, such as cigarettes, e-cigarettes, and chewing tobacco. If you need help quitting, ask your health care provider. Do not use drugs. If you are sexually active, practice safe sex. Use a condom or  other form of protection to prevent STIs (sexually transmitted infections). If you do not wish to become pregnant, use a form of birth control. If you plan to become pregnant, see your health care provider for a prepregnancy visit. Find healthy ways to cope with  stress, such as: Meditation, yoga, or listening to music. Journaling. Talking to a trusted person. Spending time with friends and family. Safety Always wear your seat belt while driving or riding in a vehicle. Do not drive: If you have been drinking alcohol. Do not ride with someone who has been drinking. When you are tired or distracted. While texting. Wear a helmet and other protective equipment during sports activities. If you have firearms in your house, make sure you follow all gun safety procedures. Seek help if you have been physically or sexually abused. What's next? Go to your health care provider once a year for an annual wellness visit. Ask your health care provider how often you should have your eyes and teeth checked. Stay up to date on all vaccines. This information is not intended to replace advice given to you by your health care provider. Make sure you discuss any questions you have with your healthcare provider. Document Revised: 08/10/2020 Document Reviewed: 08/24/2018 Elsevier Patient Education  2022 [employer].

## 2021-08-01 LAB — CBC WITH DIFFERENTIAL/PLATELET
Absolute Monocytes: 325 cells/uL (ref 200–950)
Basophils Absolute: 62 cells/uL (ref 0–200)
Basophils Relative: 1.1 %
Eosinophils Absolute: 39 cells/uL (ref 15–500)
Eosinophils Relative: 0.7 %
HCT: 31.6 % — ABNORMAL LOW (ref 35.0–45.0)
Hemoglobin: 10 g/dL — ABNORMAL LOW (ref 11.7–15.5)
Lymphs Abs: 2010 cells/uL (ref 850–3900)
MCH: 26.5 pg — ABNORMAL LOW (ref 27.0–33.0)
MCHC: 31.6 g/dL — ABNORMAL LOW (ref 32.0–36.0)
MCV: 83.6 fL (ref 80.0–100.0)
MPV: 10.8 fL (ref 7.5–12.5)
Monocytes Relative: 5.8 %
Neutro Abs: 3164 cells/uL (ref 1500–7800)
Neutrophils Relative %: 56.5 %
Platelets: 356 10*3/uL (ref 140–400)
RBC: 3.78 10*6/uL — ABNORMAL LOW (ref 3.80–5.10)
RDW: 14.2 % (ref 11.0–15.0)
Total Lymphocyte: 35.9 %
WBC: 5.6 10*3/uL (ref 3.8–10.8)

## 2021-08-01 LAB — COMPLETE METABOLIC PANEL WITH GFR
AG Ratio: 1.2 (calc) (ref 1.0–2.5)
ALT: 9 U/L (ref 6–29)
AST: 14 U/L (ref 10–30)
Albumin: 4.2 g/dL (ref 3.6–5.1)
Alkaline phosphatase (APISO): 55 U/L (ref 31–125)
BUN: 14 mg/dL (ref 7–25)
CO2: 27 mmol/L (ref 20–32)
Calcium: 9.3 mg/dL (ref 8.6–10.2)
Chloride: 104 mmol/L (ref 98–110)
Creat: 0.76 mg/dL (ref 0.50–0.97)
Globulin: 3.4 g/dL (calc) (ref 1.9–3.7)
Glucose, Bld: 65 mg/dL (ref 65–99)
Potassium: 4.7 mmol/L (ref 3.5–5.3)
Sodium: 138 mmol/L (ref 135–146)
Total Bilirubin: 0.4 mg/dL (ref 0.2–1.2)
Total Protein: 7.6 g/dL (ref 6.1–8.1)
eGFR: 105 mL/min/{1.73_m2} (ref >=60)

## 2021-08-01 LAB — T4, FREE: Free T4: 1.2 ng/dL (ref 0.8–1.8)

## 2021-08-01 LAB — TSH: TSH: 0.87 mIU/L

## 2021-08-17 ENCOUNTER — Ambulatory Visit
Admission: RE | Admit: 2021-08-17 | Discharge: 2021-08-17 | Disposition: A | Discharge status: Home / Self Care | Payer: BC Managed Care – PPO | Source: Ambulatory Visit | Attending: Family Medicine | Admitting: Family Medicine

## 2021-08-17 ENCOUNTER — Other Ambulatory Visit: Payer: Self-pay

## 2021-08-17 DIAGNOSIS — E049 Nontoxic goiter, unspecified: Secondary | ICD-10-CM | POA: Diagnosis not present

## 2021-08-17 DIAGNOSIS — E042 Nontoxic multinodular goiter: Secondary | ICD-10-CM

## 2021-08-18 ENCOUNTER — Ambulatory Visit: Payer: Self-pay | Admitting: Family Medicine

## 2021-08-19 ENCOUNTER — Other Ambulatory Visit: Payer: Self-pay

## 2021-08-19 ENCOUNTER — Encounter: Payer: Self-pay | Admitting: Family Medicine

## 2021-08-19 ENCOUNTER — Ambulatory Visit (INDEPENDENT_AMBULATORY_CARE_PROVIDER_SITE_OTHER): Payer: BC Managed Care – PPO | Admitting: Family Medicine

## 2021-08-19 VITALS — BP 118/76 | HR 91 | Temp 98.2°F | Ht 68.0 in | Wt 168.6 lb

## 2021-08-19 DIAGNOSIS — D649 Anemia, unspecified: Secondary | ICD-10-CM

## 2021-08-19 NOTE — Progress Notes (Signed)
Patient ID: Beth Cox, female    DOB: May 17, 1986, 35 y.o.   MRN: [id number]  PCP: [name], PA-C  Chief Complaint  Patient presents with   Anemia    Subjective:   Beth Cox is a 35 y.o. female, presents to clinic with CC of the following:  HPI  Anemia - labs with recent physical reviewed Hx of positive ANA and dx of fibromyalgia Irregular menses - cycle every 28-37 d, bleeds for 4-5 d, tampons and panty liners changes tampon every 3-4 hours No melena, intermittent blood on stool No past hemoccult     Patient Active Problem List   Diagnosis Date Noted   Multinodular goiter 08/05/2020   Left breast mass 08/02/2016   Positive ANA (antinuclear antibody) 07/22/2016   Fibromyalgia syndrome 09/23/2015   Chronic pelvic pain in female 09/23/2015   Allergic rhinitis with postnasal drip 07/25/2015   Migraine without aura and without status migrainosus, not intractable       Current Outpatient Medications:    Multiple Vitamin (MULTIVITAMIN) tablet, Take 1 tablet by mouth daily., Disp: , Rfl:    Allergies  Allergen Reactions   Shellfish Allergy Other (See Comments)   Apple    Peanuts  [Peanut Oil]     Other reaction(s): SHORTNESS OF BREATH   Shellfish-Derived Products     Other reaction(s): Other (See Comments)     Social History   Tobacco Use   Smoking status: Never   Smokeless tobacco: Never  Vaping Use   Vaping Use: Never used  Substance Use Topics   Alcohol use: No    Alcohol/week: 0.0 standard drinks   Drug use: No      Chart Review Today: I personally reviewed active problem list, medication list, allergies, family history, social history, health maintenance, notes from last encounter, lab results, imaging with the patient/caregiver today.   Review of Systems  Constitutional: Negative.   HENT: Negative.    Eyes: Negative.   Respiratory: Negative.    Cardiovascular: Negative.   Gastrointestinal: Negative.   Endocrine:  Negative.   Genitourinary: Negative.   Musculoskeletal: Negative.   Skin: Negative.   Allergic/Immunologic: Negative.   Neurological: Negative.   Hematological: Negative.   Psychiatric/Behavioral: Negative.    All other systems reviewed and are negative.     Objective:   Vitals:   08/19/21 1341  BP: 118/76  Pulse: 91  Temp: 98.2 F (36.8 C)  SpO2: 99%  Weight: 168 lb 9.6 oz (76.5 kg)  Height: $Remove'5\' 8"'sgsfQmh$  (1.727 m)    Body mass index is 25.64 kg/m.  Physical Exam Vitals and nursing note reviewed.  Constitutional:      General: She is not in acute distress.    Appearance: Normal appearance. She is well-developed and normal weight. She is not ill-appearing, toxic-appearing or diaphoretic.  HENT:     Head: Normocephalic and atraumatic.     Right Ear: External ear normal.     Left Ear: External ear normal.     Nose: Nose normal.     Mouth/Throat:     Mouth: Mucous membranes are moist.     Pharynx: Oropharynx is clear. No oropharyngeal exudate or posterior oropharyngeal erythema.  Eyes:     General:        Right eye: No discharge.        Left eye: No discharge.     Conjunctiva/sclera: Conjunctivae normal.  Neck:     Trachea: No tracheal deviation.  Cardiovascular:  Rate and Rhythm: Normal rate and regular rhythm.     Pulses: Normal pulses.     Heart sounds: Normal heart sounds.  Pulmonary:     Effort: Pulmonary effort is normal. No respiratory distress.     Breath sounds: Normal breath sounds. No stridor.  Skin:    General: Skin is warm and dry.     Capillary Refill: Capillary refill takes less than 2 seconds.     Coloration: Skin is not pale.     Findings: No rash.  Neurological:     Mental Status: She is alert. Mental status is at baseline.     Motor: No abnormal muscle tone.     Coordination: Coordination normal.  Psychiatric:        Mood and Affect: Mood normal.        Behavior: Behavior normal.     Results for orders placed or performed in visit on  07/31/21  TSH  Result Value Ref Range   TSH 0.87 mIU/L  COMPLETE METABOLIC PANEL WITH GFR  Result Value Ref Range   Glucose, Bld 65 65 - 99 mg/dL   BUN 14 7 - 25 mg/dL   Creat 0.76 0.50 - 0.97 mg/dL   eGFR 105 > OR = 60 mL/min/1.71m2   BUN/Creatinine Ratio NOT APPLICABLE 6 - 22 (calc)   Sodium 138 135 - 146 mmol/L   Potassium 4.7 3.5 - 5.3 mmol/L   Chloride 104 98 - 110 mmol/L   CO2 27 20 - 32 mmol/L   Calcium 9.3 8.6 - 10.2 mg/dL   Total Protein 7.6 6.1 - 8.1 g/dL   Albumin 4.2 3.6 - 5.1 g/dL   Globulin 3.4 1.9 - 3.7 g/dL (calc)   AG Ratio 1.2 1.0 - 2.5 (calc)   Total Bilirubin 0.4 0.2 - 1.2 mg/dL   Alkaline phosphatase (APISO) 55 31 - 125 U/L   AST 14 10 - 30 U/L   ALT 9 6 - 29 U/L  CBC w/Diff/Platelet  Result Value Ref Range   WBC 5.6 3.8 - 10.8 Thousand/uL   RBC 3.78 (L) 3.80 - 5.10 Million/uL   Hemoglobin 10.0 (L) 11.7 - 15.5 g/dL   HCT 31.6 (L) 35.0 - 45.0 %   MCV 83.6 80.0 - 100.0 fL   MCH 26.5 (L) 27.0 - 33.0 pg   MCHC 31.6 (L) 32.0 - 36.0 g/dL   RDW 14.2 11.0 - 15.0 %   Platelets 356 140 - 400 Thousand/uL   MPV 10.8 7.5 - 12.5 fL   Neutro Abs 3,164 1,500 - 7,800 cells/uL   Lymphs Abs 2,010 850 - 3,900 cells/uL   Absolute Monocytes 325 200 - 950 cells/uL   Eosinophils Absolute 39 15 - 500 cells/uL   Basophils Absolute 62 0 - 200 cells/uL   Neutrophils Relative % 56.5 %   Total Lymphocyte 35.9 %   Monocytes Relative 5.8 %   Eosinophils Relative 0.7 %   Basophils Relative 1.1 %  T4, free  Result Value Ref Range   Free T4 1.2 0.8 - 1.8 ng/dL       Assessment & Plan:      ICD-10-CM   1. Anemia, unspecified type  D64.9 Iron, TIBC and Ferritin Panel    CBC with Differential/Platelet    B12 and Folate Panel   no evident source of bleeding, menses not heavy, will r/o GI blood loss and look for deficiencies    Pt asx, discussed options for f/up - GI or hematology, or can follow in  primary care     [name], PA-C 08/19/21 2:04 PM

## 2021-08-20 LAB — CBC WITH DIFFERENTIAL/PLATELET
Absolute Monocytes: 515 cells/uL (ref 200–950)
Basophils Absolute: 28 cells/uL (ref 0–200)
Basophils Relative: 0.3 %
Eosinophils Absolute: 64 cells/uL (ref 15–500)
Eosinophils Relative: 0.7 %
HCT: 31.9 % — ABNORMAL LOW (ref 35.0–45.0)
Hemoglobin: 9.7 g/dL — ABNORMAL LOW (ref 11.7–15.5)
Lymphs Abs: 2079 cells/uL (ref 850–3900)
MCH: 25.6 pg — ABNORMAL LOW (ref 27.0–33.0)
MCHC: 30.4 g/dL — ABNORMAL LOW (ref 32.0–36.0)
MCV: 84.2 fL (ref 80.0–100.0)
MPV: 10.9 fL (ref 7.5–12.5)
Monocytes Relative: 5.6 %
Neutro Abs: 6514 cells/uL (ref 1500–7800)
Neutrophils Relative %: 70.8 %
Platelets: 373 10*3/uL (ref 140–400)
RBC: 3.79 10*6/uL — ABNORMAL LOW (ref 3.80–5.10)
RDW: 14.5 % (ref 11.0–15.0)
Total Lymphocyte: 22.6 %
WBC: 9.2 10*3/uL (ref 3.8–10.8)

## 2021-08-20 LAB — IRON,TIBC AND FERRITIN PANEL
%SAT: 9 % (calc) — ABNORMAL LOW (ref 16–45)
Ferritin: 7 ng/mL — ABNORMAL LOW (ref 16–154)
Iron: 41 ug/dL (ref 40–190)
TIBC: 452 mcg/dL (calc) — ABNORMAL HIGH (ref 250–450)

## 2021-08-20 LAB — B12 AND FOLATE PANEL
Folate: 13.4 ng/mL
Vitamin B-12: 559 pg/mL (ref 200–1100)

## 2021-08-25 ENCOUNTER — Telehealth: Payer: Self-pay

## 2021-08-25 ENCOUNTER — Ambulatory Visit (INDEPENDENT_AMBULATORY_CARE_PROVIDER_SITE_OTHER): Payer: BC Managed Care – PPO

## 2021-08-25 DIAGNOSIS — D649 Anemia, unspecified: Secondary | ICD-10-CM | POA: Diagnosis not present

## 2021-08-25 LAB — POC HEMOCCULT BLD/STL (HOME/3-CARD/SCREEN)
Card #2 Fecal Occult Blod, POC: NEGATIVE
Card #3 Fecal Occult Blood, POC: NEGATIVE
Fecal Occult Blood, POC: NEGATIVE

## 2021-08-25 NOTE — Telephone Encounter (Signed)
PT dropped off stool card aond is in the lab.

## 2021-08-25 NOTE — Telephone Encounter (Signed)
Done

## 2021-08-26 ENCOUNTER — Other Ambulatory Visit: Payer: Self-pay

## 2021-08-26 DIAGNOSIS — D649 Anemia, unspecified: Secondary | ICD-10-CM

## 2021-09-07 ENCOUNTER — Encounter: Payer: Self-pay | Admitting: Family Medicine

## 2022-02-08 ENCOUNTER — Ambulatory Visit: Payer: BC Managed Care – PPO | Admitting: Family Medicine

## 2022-03-26 ENCOUNTER — Ambulatory Visit (INDEPENDENT_AMBULATORY_CARE_PROVIDER_SITE_OTHER): Payer: Self-pay | Admitting: Family Medicine

## 2022-03-26 ENCOUNTER — Encounter: Payer: Self-pay | Admitting: Family Medicine

## 2022-03-26 VITALS — BP 116/74 | HR 95 | Temp 98.8°F | Resp 16 | Ht 67.5 in | Wt 176.3 lb

## 2022-03-26 DIAGNOSIS — N912 Amenorrhea, unspecified: Secondary | ICD-10-CM

## 2022-03-26 DIAGNOSIS — Z3201 Encounter for pregnancy test, result positive: Secondary | ICD-10-CM

## 2022-03-26 LAB — POCT URINE PREGNANCY: Preg Test, Ur: POSITIVE — AB

## 2022-03-26 MED ORDER — DOXYLAMINE-PYRIDOXINE 10-10 MG PO TBEC: 1.0000 | DELAYED_RELEASE_TABLET | Freq: Every evening | ORAL | 2 refills | Status: DC | PRN

## 2022-03-26 NOTE — Progress Notes (Signed)
? ? ?Patient ID: Beth Cox, female    DOB: Sep 10, 1986, 36 y.o.   MRN: [id number] ? ?PCP: [name], PA-C ? ?Chief Complaint  ?Patient presents with  ? Amenorrhea  ?  Pt took 2 home pregnancy test-positive. Pt would like blood work to determine positive pregnancy  ? ? ?Subjective:  ? ?Beth Cox is a 36 y.o. female, presents to clinic with CC of the following: ? ?HPI  ? ?Suspected pregnancy with two positive home tests ? ?LMP - 02/16/22 ?Results for orders placed or performed in visit on 03/26/22  ?POCT urine pregnancy  ?Result Value Ref Range  ? Preg Test, Ur Positive (A) Negative  ? ?She denies any cramping, vaginal bleeding or spotting, no urinary symptoms, vaginal symptoms, flank pain ?Very mild nausea has just started ? ? ? ?Patient Active Problem List  ? Diagnosis Date Noted  ? Multinodular goiter 08/05/2020  ? Left breast mass 08/02/2016  ? Positive ANA (antinuclear antibody) 07/22/2016  ? Fibromyalgia syndrome 09/23/2015  ? Chronic pelvic pain in female 09/23/2015  ? Allergic rhinitis with postnasal drip 07/25/2015  ? Migraine without aura and without status migrainosus, not intractable   ? ? ? ? ?Current Outpatient Medications:  ?  Multiple Vitamin (MULTIVITAMIN) tablet, Take 1 tablet by mouth daily. (Patient not taking: Reported on 03/26/2022), Disp: , Rfl:  ? ? ?Allergies  ?Allergen Reactions  ? Shellfish Allergy Other (See Comments)  ? Apple Juice   ? Peanuts  [Peanut Oil]   ?  Other reaction(s): SHORTNESS OF BREATH  ? Shellfish-Derived Products   ?  Other reaction(s): Other (See Comments)  ? ? ? ?Social History  ? ?Tobacco Use  ? Smoking status: Never  ? Smokeless tobacco: Never  ?Vaping Use  ? Vaping Use: Never used  ?Substance Use Topics  ? Alcohol use: No  ?  Alcohol/week: 0.0 standard drinks  ? Drug use: No  ?  ? ? ?Chart Review Today: ?I personally reviewed active problem list, medication list, allergies, family history, social history, health maintenance, notes from last encounter,  lab results, imaging with the patient/caregiver today. ? ? ?Review of Systems  ?Constitutional: Negative.   ?HENT: Negative.    ?Eyes: Negative.   ?Respiratory: Negative.    ?Cardiovascular: Negative.   ?Gastrointestinal: Negative.   ?Endocrine: Negative.   ?Genitourinary: Negative.   ?Musculoskeletal: Negative.   ?Skin: Negative.   ?Allergic/Immunologic: Negative.   ?Neurological: Negative.   ?Hematological: Negative.   ?Psychiatric/Behavioral: Negative.    ?All other systems reviewed and are negative. ? ?   ?Objective:  ? ?Vitals:  ? 03/26/22 1350  ?BP: 116/74  ?Pulse: 95  ?Resp: 16  ?Temp: 98.8 ?F (37.1 ?C)  ?TempSrc: Oral  ?SpO2: 100%  ?Weight: 176 lb 4.8 oz (80 kg)  ?Height: 5' 7.5" (1.715 m)  ?  ?Body mass index is 27.2 kg/m?. ? ?Physical Exam ?Vitals and nursing note reviewed.  ?Constitutional:   ?   Appearance: She is well-developed.  ?HENT:  ?   Head: Normocephalic and atraumatic.  ?   Nose: Nose normal.  ?Eyes:  ?   General:     ?   Right eye: No discharge.     ?   Left eye: No discharge.  ?   Conjunctiva/sclera: Conjunctivae normal.  ?Neck:  ?   Trachea: No tracheal deviation.  ?Cardiovascular:  ?   Rate and Rhythm: Normal rate and regular rhythm.  ?   Pulses: Normal pulses.  ?   Heart  sounds: Normal heart sounds.  ?Pulmonary:  ?   Effort: Pulmonary effort is normal. No respiratory distress.  ?   Breath sounds: Normal breath sounds. No stridor.  ?Abdominal:  ?   General: Bowel sounds are normal.  ?   Palpations: Abdomen is soft.  ?   Tenderness: There is no abdominal tenderness.  ?Musculoskeletal:     ?   General: Normal range of motion.  ?Skin: ?   General: Skin is warm and dry.  ?   Findings: No rash.  ?Neurological:  ?   Mental Status: She is alert.  ?   Motor: No abnormal muscle tone.  ?   Coordination: Coordination normal.  ?Psychiatric:     ?   Behavior: Behavior normal.  ?  ? ?Results for orders placed or performed in visit on 03/26/22  ?POCT urine pregnancy  ?Result Value Ref Range  ? Preg Test,  Ur Positive (A) Negative  ? ? ?   ?Assessment & Plan:  ? ?  ICD-10-CM   ?1. Amenorrhea  N91.2 POCT urine pregnancy  ?  Doxylamine-Pyridoxine (DICLEGIS) 10-10 MG TBEC  ?  Ambulatory referral to Obstetrics / Gynecology  ?  ?2. Positive pregnancy test  Z32.01 Ambulatory referral to Obstetrics / Gynecology  ?  ? ?Patient's last menstrual period was 02/16/2022 (exact date). ?EDD 11/23/2022 ?Discussed antiemetic medications -Diclegis dosing reviewed and medication sent in ?Encouraged her to start prenatals or folic acid supplementation ?Reviewed medications in first trimester to avoid ? ? ? ? ?[name], PA-C ?03/26/22 2:11 PM ? ?

## 2022-03-26 NOTE — Patient Instructions (Addendum)
Let me know if you need more antinausea meds prior to seeing OBGYN  ? ?Meds start with one pill at bedtime, increase to 2 pills at bedtime and increase further to 1 in morning, 1 at lunchtime and 2 at bedtime with severe nausea and vomiting  ? ?Pregnancy After Age 36 ?Women who become pregnant after the age of 36 have a higher risk for certain problems during pregnancy. This is because older women may already have health problems before becoming pregnant. Older women who are healthy before pregnancy may still develop problems during pregnancy. These problems may affect the mother, the unborn baby (fetus), or both. ?How does this affect me? ?If you are over age 10 and you want to become pregnant or are pregnant, you may have a higher risk of: ?Not being able to get pregnant (infertility). ?Going into labor early (preterm labor). ?Needing surgical delivery of your baby (cesarean delivery, or C-section). ?Having complications during pregnancy, such as high blood pressure and other symptoms (preeclampsia). ?Having diabetes during pregnancy (gestational diabetes). ?Being pregnant with more than one baby. ?Loss of the unborn baby before 20 weeks (miscarriage) or after 20 weeks of pregnancy (stillbirth). ?How does this affect my baby? ?Babies born to women over the age of 36 have a higher risk for: ?Being born early (prematurity). ?Low birth weight, which is less than 5 lb, 8 oz (2.5 kg). ?Birth defects, such as Down syndrome and cleft palate. ?Health complications, including problems with growth and development. ?Prenatal care ?All women should see their health care provider before they try to become pregnant. This is especially important for women over the age of 36. Tell your health care provider about: ?Any health problems you have. ?Any medicines you take. ?Any family history of health problems or chromosome-related defects. ?Any problems you have had with past surgeries, pregnancies, or deliveries. ?If you are over  age 36 and you plan to become pregnant, start taking a daily multivitamin a month or more before you try to get pregnant. Your multivitamin should contain 400 mcg (micrograms) of folic acid. ?If you are over age 20 and pregnant, make sure you: ?Keep taking your multivitamin unless your health care provider tells you not to take it. ?Keep all follow-up visits. This includes prenatal visits. This is important. ?Talk with your health care provider about other prenatal screening tests that you may need. ?Prenatal tests ?Screening tests show whether your baby has a higher risk for birth defects than other babies. Screening tests include: ?More frequent ultrasound tests to look for markers that indicate a risk for birth defects. ?Maternal blood screening. These are blood tests that help to determine your baby's risk for birth defects. ?Screening tests do not show whether your baby has or does not have birth defects. They only show your baby's risk for certain birth defects. If your screening tests show that risk factors are present, you may need tests to confirm the birth defect (diagnostic testing). These tests may include: ?Chorionic villus sampling. For this procedure, a tissue sample is taken from the organ that forms in your uterus to nourish your baby (placenta). The sample is removed through your cervix or abdomen and tested. ?Amniocentesis. For this procedure, a small amount of the fluid that surrounds the baby in the uterus (amniotic fluid) is removed and tested. ?You will also need screenings tests for gestational diabetes in the first trimester, as well as later in pregnancy. ?Staying healthy during pregnancy ?Staying healthy during pregnancy can help you and your  baby to have a lower risk for problems during pregnancy, during delivery, or both. Talk with your health care provider for specific instructions about staying healthy during your pregnancy. ?General tips ?Nutrition ? ?At each meal, eat a variety of  foods from each of the five food groups. These groups include: ?Proteins such as lean meats, poultry, fish that is low in fat, beans, eggs, and nuts. ?Vegetables such as leafy greens, raw and cooked vegetables, and vegetable juice. ?Fruits that are fresh, frozen, or canned, or 100% fruit juice. ?Dairy products such as low-fat yogurt, cheese, and milk. ?Whole grains including rice, cereal, pasta, and bread. ?Follow instructions from your health care provider about eating and drinking restrictions during pregnancy. ?Do not eat raw eggs, raw meat, or raw fish or seafood. ?Do not eat any fish that contains high amounts of mercury, such as swordfish or mackerel. ?Managing weight gain ?Ask your health care provider how much weight gain is healthy during pregnancy. ?Stay at a healthy weight. If needed, work with your health care provider to lose weight safely. ?Exercise regularly, as directed by your health care provider. Ask your health care provider what forms of exercise are safe for you. ?Follow these instructions at home: ?Do not use any products that contain nicotine or tobacco. These products include cigarettes, chewing tobacco, and vaping devices, such as e-cigarettes. If you need help quitting, ask your health care provider. ?Drink enough fluid to keep your urine pale yellow. ?Do not drink alcohol, use drugs, or abuse prescription medicine. ?Take over-the-counter and prescription medicines only as told by your health care provider. ?Do not use hot tubs, steam rooms, or saunas. ?Talk with your health care provider about your risk of exposure to harmful environmental conditions. This includes exposure to chemicals, radiation, cleaning products, and cat feces. Follow advice from your health care provider about how to limit your exposure. ?Summary ?Women who become pregnant after the age of 109 have a higher risk for complications during pregnancy. ?Problems may affect the mother, the unborn baby (fetus), or both. ?All  women should see their health care provider before they try to become pregnant. This is especially important for women over the age of 16. ?Staying healthy during pregnancy can help both you and your baby to have a lower risk for some of the problems that can happen during pregnancy, during delivery, or both. ?This information is not intended to replace advice given to you by your health care provider. Make sure you discuss any questions you have with your health care provider. ?Document Revised: 09/01/2020 Document Reviewed: 09/01/2020 ?Elsevier Patient Education ? [place]. ? ?

## 2022-06-10 IMAGING — US US THYROID
1 series · 15 of 25 positions shown · non-contrast
Comparison: None.

CLINICAL DATA: Thyromegaly

EXAM:
THYROID ULTRASOUND
TECHNIQUE: Ultrasound examination of the thyroid gland and adjacent soft
tissues was performed.

[Series 1: us thyroid · 15 of 67 slices shown]
[im 1/67]
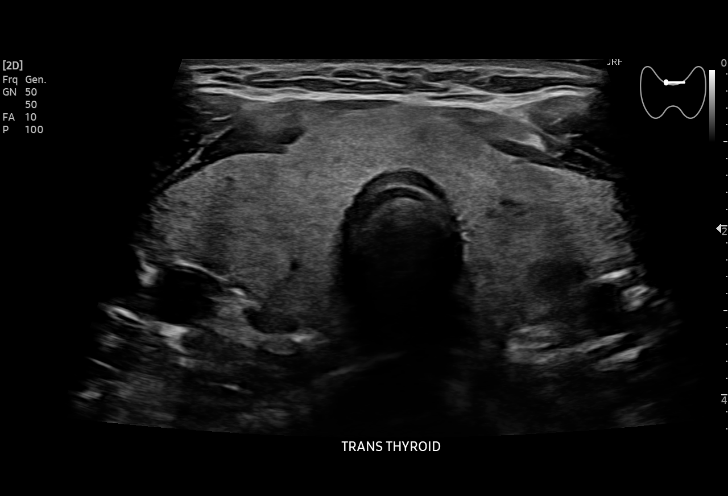
[im 6/67]
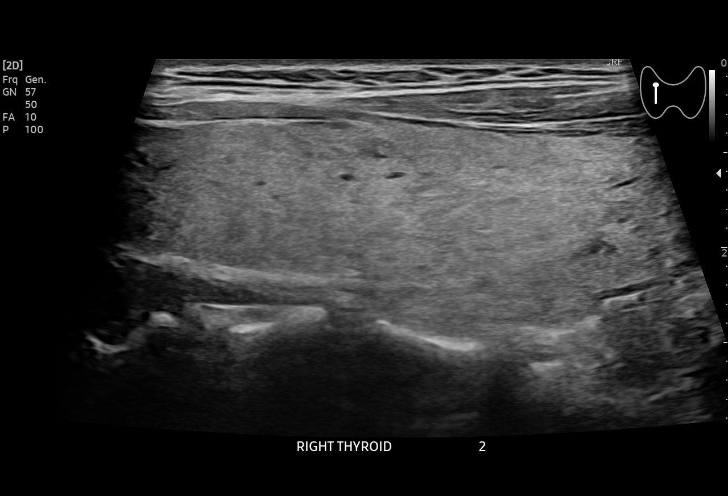
[im 12/67]
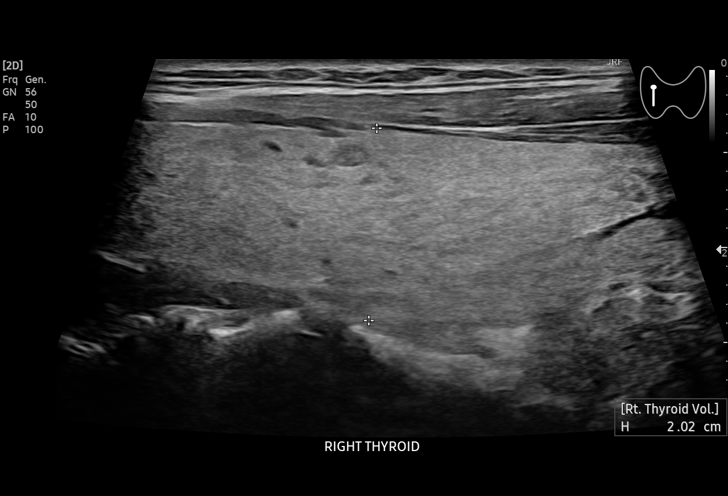
[im 14/67]
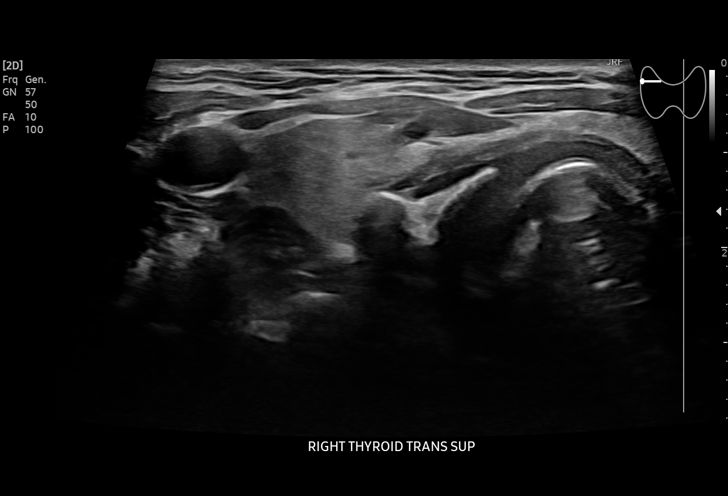
[im 20/67]
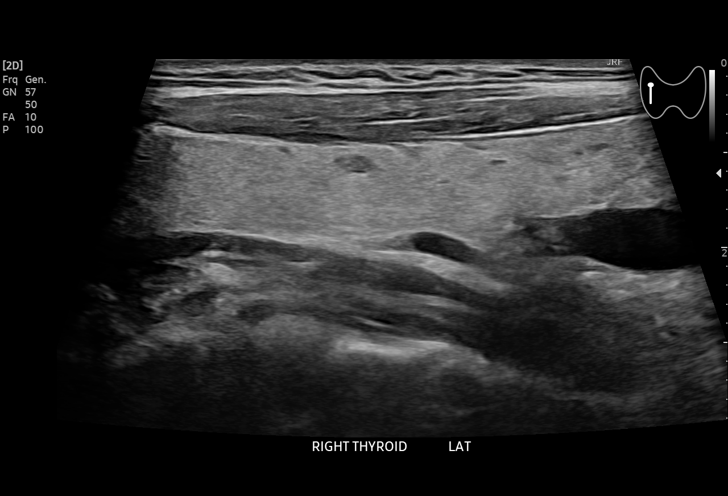
[im 25/67]
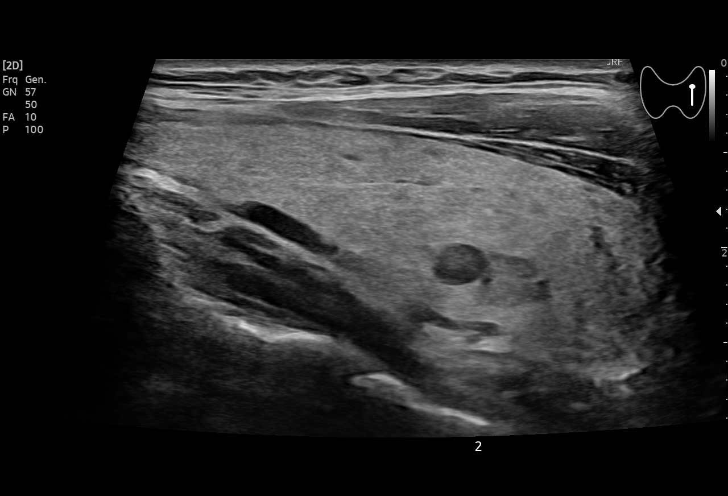
[im 28/67]
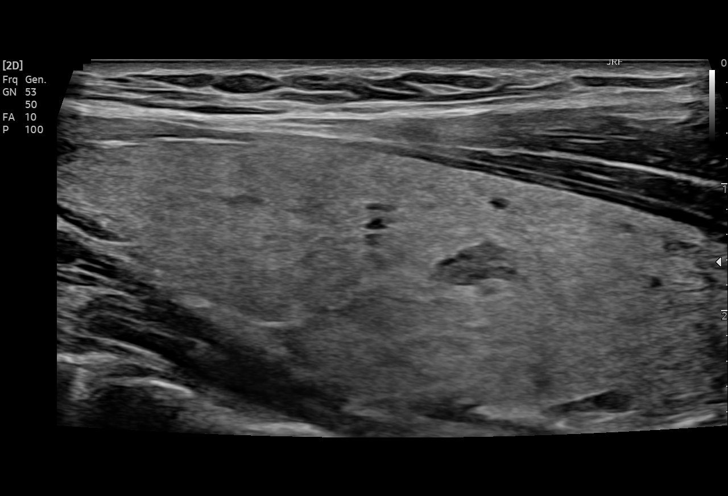
[im 34/67]
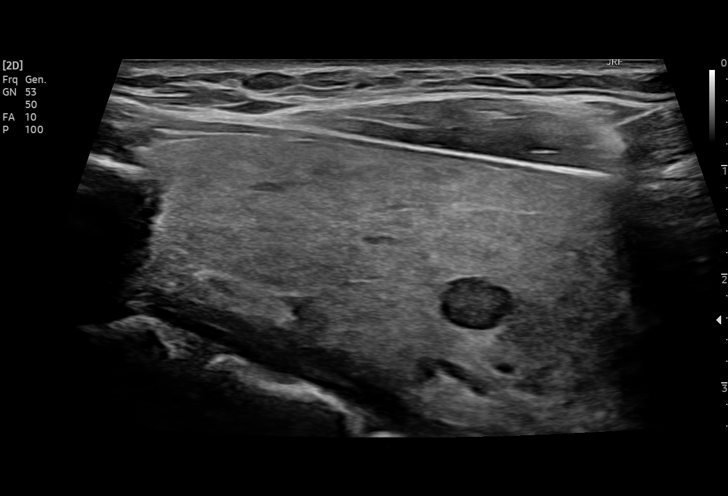
[im 39/67]
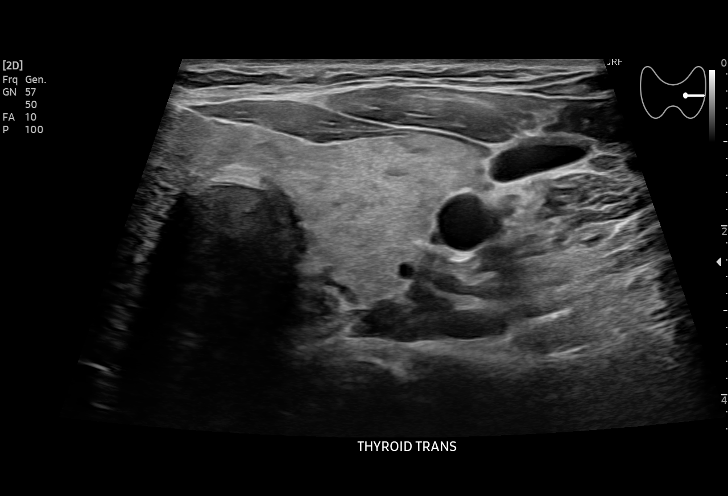
[im 42/67]
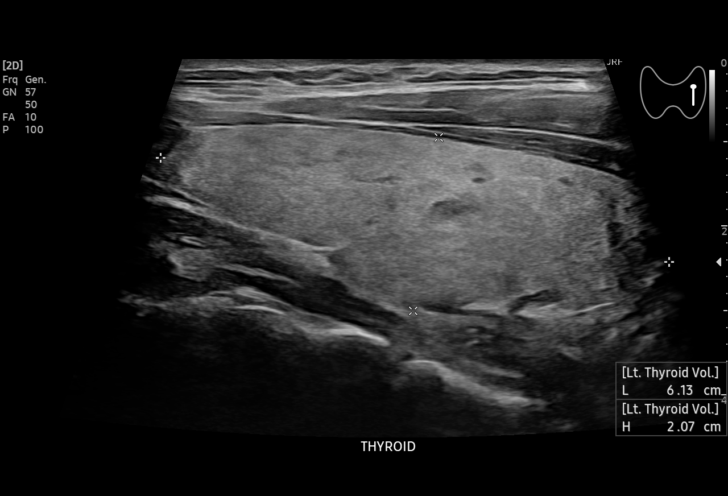
[im 47/67]
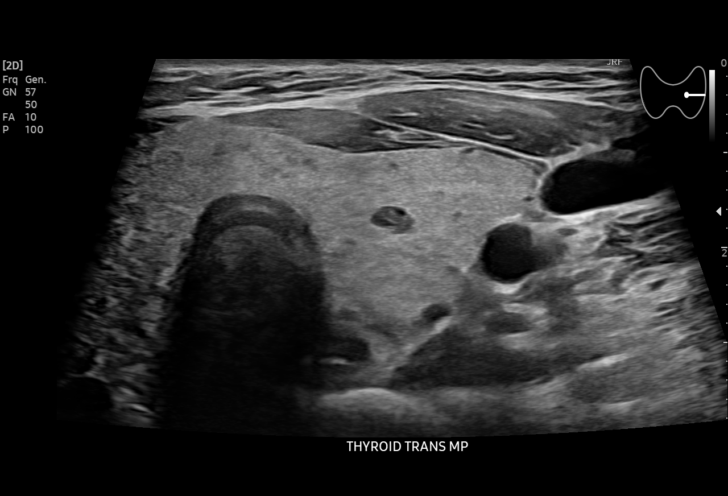
[im 53/67]
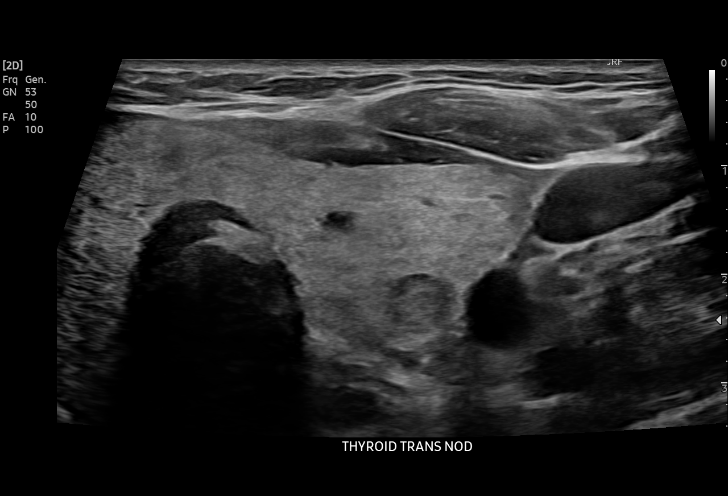
[im 56/67]
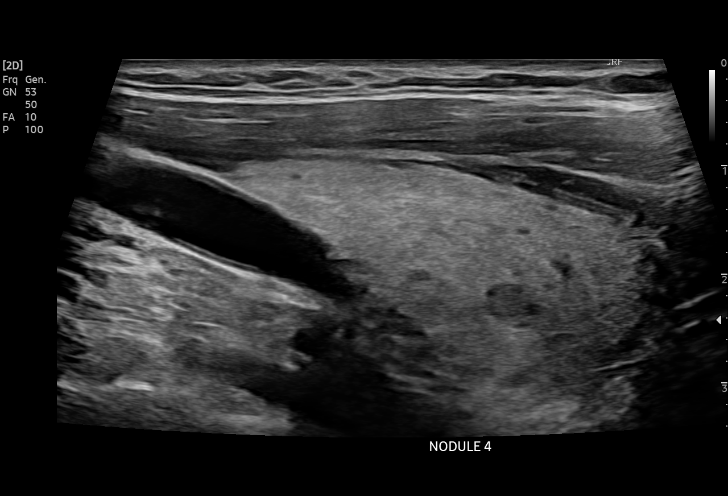
[im 61/67]
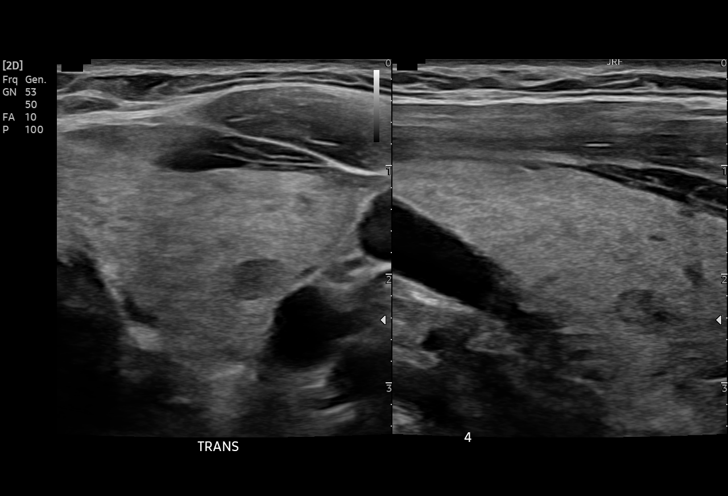
[im 67/67]
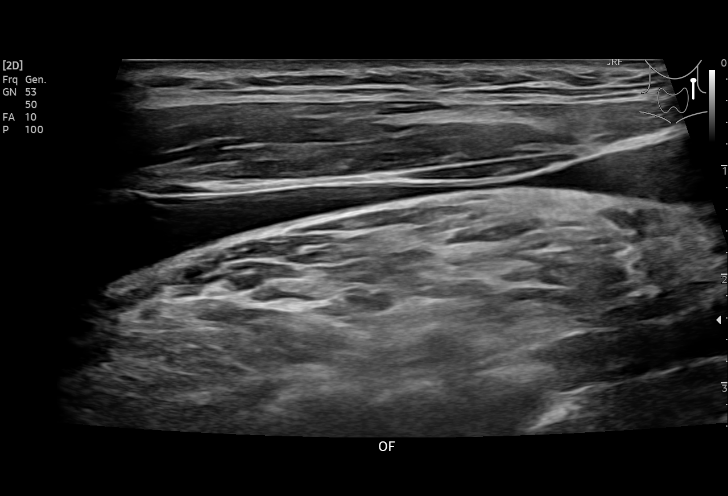

[15 of 25 positions shown; findings below may reference images not displayed]

FINDINGS: Parenchymal Echotexture: Mildly heterogenous

Isthmus: 0.8 cm

Right lobe: 7.7 x 2 x 2.5 cm

Left lobe: 6.1 x 2.1 x 2.4 cm

_________________________________________________________

Estimated total number of nodules >/= 1 cm: 0

Number of spongiform nodules >/=  2 cm not described below (TR1): 0

Number of mixed cystic and solid nodules >/= 1.5 cm not described
below (TR2): 0

_________________________________________________________

Nodule # 2:

Location: Left; Inferior

Maximum size: 0.7 cm; Other 2 dimensions: 0.5 x 0.6 cm

Composition: solid/almost completely solid (2)

Echogenicity: very hypoechoic (3)

Shape: not taller-than-wide (0)

Margins: lobulated/irregular (2) (this is best appreciated on images
36 and 37).

Echogenic foci: none (0)

ACR TI-RADS total points: 7.

ACR TI-RADS risk category: TR5 (>/= 7 points).

ACR TI-RADS recommendations:

*Given size (>/= 0.5 - 0.9 cm) and appearance, a follow-up
ultrasound in 1 year should be considered based on TI-RADS criteria.

_________________________________________________________

There are additional subcentimeter left-sided thyroid nodules that
do not meet criteria for follow-up or FNA.
IMPRESSION: 1. Multinodular goiter as detailed above.
2. There is a 0.7 cm thyroid nodule in the left inferior thyroid
gland (labeled 2). This thyroid nodule is very hypoechoic and
demonstrates a mildly lobular contour, and as such is labeled as a
TR5 thyroid nodule on today's study. A 1 year follow-up ultrasound
is recommended to confirm stability.

The above is in keeping with the ACR TI-RADS recommendations - [HOSPITAL] 0973;[DATE].

## 2022-08-05 ENCOUNTER — Encounter: Payer: BC Managed Care – PPO | Admitting: Family Medicine

## 2022-08-05 DIAGNOSIS — Z0000 Encounter for general adult medical examination without abnormal findings: Secondary | ICD-10-CM

## 2022-08-05 DIAGNOSIS — Z124 Encounter for screening for malignant neoplasm of cervix: Secondary | ICD-10-CM

## 2022-08-26 ENCOUNTER — Encounter: Payer: Self-pay | Admitting: Family Medicine

## 2022-09-01 NOTE — Patient Instructions (Addendum)
I will likely call you with the results over the weekend.  Preventive Care 47-36 Years Old, Female Preventive care refers to lifestyle choices and visits with your health care provider that can promote health and wellness. Preventive care visits are also called wellness exams. What can I expect for my preventive care visit? Counseling During your preventive care visit, your health care provider may ask about your: Medical history, including: Past medical problems. Family medical history. Pregnancy history. Current health, including: Menstrual cycle. Method of birth control. Emotional well-being. Home life and relationship well-being. Sexual activity and sexual health. Lifestyle, including: Alcohol, nicotine or tobacco, and drug use. Access to firearms. Diet, exercise, and sleep habits. Work and work Statistician. Sunscreen use. Safety issues such as seatbelt and bike helmet use. Physical exam Your health care provider may check your: Height and weight. These may be used to calculate your BMI (body mass index). BMI is a measurement that tells if you are at a healthy weight. Waist circumference. This measures the distance around your waistline. This measurement also tells if you are at a healthy weight and may help predict your risk of certain diseases, such as type 2 diabetes and high blood pressure. Heart rate and blood pressure. Body temperature. Skin for abnormal spots. What immunizations do I need?  Vaccines are usually given at various ages, according to a schedule. Your health care provider will recommend vaccines for you based on your age, medical history, and lifestyle or other factors, such as travel or where you work. What tests do I need? Screening Your health care provider may recommend screening tests for certain conditions. This may include: Pelvic exam and Pap test. Lipid and cholesterol levels. Diabetes screening. This is done by checking your blood sugar  (glucose) after you have not eaten for a while (fasting). Hepatitis B test. Hepatitis C test. HIV (human immunodeficiency virus) test. STI (sexually transmitted infection) testing, if you are at risk. BRCA-related cancer screening. This may be done if you have a family history of breast, ovarian, tubal, or peritoneal cancers. Talk with your health care provider about your test results, treatment options, and if necessary, the need for more tests. Follow these instructions at home: Eating and drinking  Eat a healthy diet that includes fresh fruits and vegetables, whole grains, lean protein, and low-fat dairy products. Take vitamin and mineral supplements as recommended by your health care provider. Do not drink alcohol if: Your health care provider tells you not to drink. You are pregnant, may be pregnant, or are planning to become pregnant. If you drink alcohol: Limit how much you have to 0-1 drink a day. Know how much alcohol is in your drink. In the [place] one drink equals one 12 oz bottle of beer (355 mL), one 5 oz glass of wine (148 mL), or one 1 oz glass of hard liquor (44 mL). Lifestyle Brush your teeth every morning and night with fluoride toothpaste. Floss one time each day. Exercise for at least 30 minutes 5 or more days each week. Do not use any products that contain nicotine or tobacco. These products include cigarettes, chewing tobacco, and vaping devices, such as e-cigarettes. If you need help quitting, ask your health care provider. Do not use drugs. If you are sexually active, practice safe sex. Use a condom or other form of protection to prevent STIs. If you do not wish to become pregnant, use a form of birth control. If you plan to become pregnant, see your health care provider for  a prepregnancy visit. Find healthy ways to manage stress, such as: Meditation, yoga, or listening to music. Journaling. Talking to a trusted person. Spending time with friends and  family. Minimize exposure to UV radiation to reduce your risk of skin cancer. Safety Always wear your seat belt while driving or riding in a vehicle. Do not drive: If you have been drinking alcohol. Do not ride with someone who has been drinking. If you have been using any mind-altering substances or drugs. While texting. When you are tired or distracted. Wear a helmet and other protective equipment during sports activities. If you have firearms in your house, make sure you follow all gun safety procedures. Seek help if you have been physically or sexually abused. What's next? Go to your health care provider once a year for an annual wellness visit. Ask your health care provider how often you should have your eyes and teeth checked. Stay up to date on all vaccines. This information is not intended to replace advice given to you by your health care provider. Make sure you discuss any questions you have with your health care provider. Document Revised: 06/10/2021 Document Reviewed: 06/10/2021 Elsevier Patient Education  [place].

## 2022-09-02 ENCOUNTER — Ambulatory Visit (INDEPENDENT_AMBULATORY_CARE_PROVIDER_SITE_OTHER): Payer: 59 | Admitting: Family Medicine

## 2022-09-02 ENCOUNTER — Encounter: Payer: Self-pay | Admitting: Family Medicine

## 2022-09-02 VITALS — BP 110/68 | HR 96 | Temp 98.0°F | Resp 16 | Ht 67.0 in | Wt 186.0 lb

## 2022-09-02 DIAGNOSIS — D509 Iron deficiency anemia, unspecified: Secondary | ICD-10-CM

## 2022-09-02 DIAGNOSIS — Z0000 Encounter for general adult medical examination without abnormal findings: Secondary | ICD-10-CM | POA: Diagnosis not present

## 2022-09-02 DIAGNOSIS — E042 Nontoxic multinodular goiter: Secondary | ICD-10-CM | POA: Diagnosis not present

## 2022-09-02 DIAGNOSIS — Z124 Encounter for screening for malignant neoplasm of cervix: Secondary | ICD-10-CM

## 2022-09-02 DIAGNOSIS — N6320 Unspecified lump in the left breast, unspecified quadrant: Secondary | ICD-10-CM

## 2022-09-02 DIAGNOSIS — Z23 Encounter for immunization: Secondary | ICD-10-CM

## 2022-09-02 DIAGNOSIS — O209 Hemorrhage in early pregnancy, unspecified: Secondary | ICD-10-CM

## 2022-09-02 NOTE — Progress Notes (Signed)
Patient: Beth Cox, Female    DOB: Sep 10, 1986, 36 y.o.   MRN: [id number] [name], PA-C Visit Date: 09/02/2022  Today's Provider: [name], PA-C   Chief Complaint  Patient presents with   Annual Exam   Subjective:   Annual physical exam:  [name] is a 36 y.o. female who presents today for complete physical exam:  Exercise/Activity:  active Diet/nutrition:  healthy Sleep:  sleeps ok  Lmp July 21, currently pregnant EGA [redacted] weeks - EDD April 24 2023 - f/up appt OBGYN in in 2-3 weeks, she has [place] on Monday Recent Miscarriage in May, spontaneous home preg tests June to prior to July  [place] on Monday with OBGYN Some blood tinge vaginal discharge - she had similar discharge with miscarriage a few months ago she asks for HCG testing No blood clots or bright red blood, no abd pain, some mild cramping on and off over the past couple weeks   [place]: No Food Insecurity (09/02/2022)  Housing: Low Risk  (09/02/2022)  Transportation Needs: No Transportation Needs (09/02/2022)  Utilities: Not At Risk (09/02/2022)  Alcohol Screen: Low Risk  (09/02/2022)  Depression (PHQ2-9): Low Risk  (09/02/2022)  Financial Resource Strain: Low Risk  (09/02/2022)  Physical Activity: Sufficiently Active (09/02/2022)  Social Connections: Socially Integrated (09/02/2022)  Stress: No Stress Concern Present (09/02/2022)  Tobacco Use: Low Risk  (09/02/2022)     Pt wished to discuss acute complaints  And do routine f/up on chronic conditions today in addition to CPE. Advised pt of separate visit billing/coding  Thyromegaly/goiter f/up- imaging and labs - no change to size  Anemia  Hemoglobin  Date Value Ref Range Status  08/19/2021 9.7 (L) 11.7 - 15.5 g/dL Final  07/31/2021 10.0 (L) 11.7 - 15.5 g/dL Final  07/31/2020 11.8 11.7 - 15.5 g/dL Final  07/13/2019 10.1 (L) 12.0 - 15.0 g/dL Final   HGB  Date Value Ref Range Status  07/03/2014 12.3 12.0 - 16.0 g/dL Final  She  denies heavy bleeding or known hx of why she is anemia Iron low as well, she was doing supplements Lab Results  Component Value Date   IRON 41 08/19/2021   TIBC 452 (H) 08/19/2021   FERRITIN 7 (L) 08/19/2021   Left breast mass lost to f/up 5-6 years ago she was supposed to do 6 month diagnostic mammog and [place]   USPSTF grade A and B recommendations - reviewed and addressed today  Depression:  Phq 9 completed today by patient, was reviewed by me with patient in the room PHQ score is neg, pt feels overall good but upset and stressed with vaginal spotting/blood tinge discharge and waiting to see if she have viable fetus or another miscarriage     09/02/2022    9:08 AM 03/26/2022    1:44 PM 08/19/2021    1:47 PM 07/31/2021    9:13 AM  PHQ 2/9 Scores  PHQ - 2 Score 1 0 0 0  PHQ- 9 Score 3 0 0 0      09/02/2022    9:08 AM 03/26/2022    1:44 PM 08/19/2021    1:47 PM 07/31/2021    9:13 AM 07/31/2020   10:48 AM  Depression screen PHQ 2/9  Decreased Interest 0 0 0 0 0  Down, Depressed, Hopeless 1 0 0 0 0  PHQ - 2 Score 1 0 0 0 0  Altered sleeping 1 0 0 0 0  Tired, decreased energy  1 0 0 0 0  Change in appetite 0 0 0 0 0  Feeling bad or failure about yourself  0 0 0 0 0  Trouble concentrating 0 0 0 0 0  Moving slowly or fidgety/restless 0 0 0 0 0  Suicidal thoughts 0 0 0 0 0  PHQ-9 Score 3 0 0 0 0  Difficult doing work/chores Somewhat difficult Not difficult at all Not difficult at all Not difficult at all Not difficult at all    Alcohol screening: [place] Office Visit from 09/02/2022 in [facility]  AUDIT-C Score 0       Immunizations and Health Maintenance: Health Maintenance  Topic Date Due   PAP SMEAR-Modifier  12/27/2021   TETANUS/TDAP  04/15/2029   Hepatitis C Screening  Completed   HIV Screening  Completed   HPV VACCINES  Aged Out   INFLUENZA VACCINE  Discontinued   COVID-19 Vaccine  Discontinued     Hep C Screening: completed  STD testing and  prevention (HIV/chl/gon/syphilis):  see above, no additional testing desired by pt today  Intimate partner violence:  safe  Sexual History/Pain during Intercourse: Married, none  Menstrual History/LMP/Abnormal Bleeding: - see hx above Patient's last menstrual period was 07/16/2022.  Incontinence Symptoms: none  Breast cancer: did diag mammo and [place] in 2017 was due to f/up  Last Mammogram: *see HM list above   Cervical cancer screening: due 01/06/2024 - in [facility], neg pap and HPV  Osteoporosis:   Discussion on osteoporosis per age, including high calcium and vitamin D supplementation, weight bearing exercises Pt is not supplementing with daily calcium/Vit D.   Skin cancer:  Hx of skin CA -  NO  Discussed atypical lesions   Colorectal cancer:   Colonoscopy is not indicated Discussed concerning signs and sx of CRC, pt denies change in bowels or blood in stool But she does have anemia and chronic pelvic pain - may need to do screening hemoccult   Lung cancer:   Low Dose CT Chest recommended if Age 74-80 years, 20 pack-year currently smoking OR have quit w/in 15years. Patient does not qualify.    Social History   Tobacco Use   Smoking status: Never   Smokeless tobacco: Never  Vaping Use   Vaping Use: Never used  Substance Use Topics   Alcohol use: No    Alcohol/week: 0.0 standard drinks of alcohol   Drug use: No     Flowsheet Row Office [facility]  AUDIT-C Score 0       Family History  Problem Relation Age of Onset   Hypertension Mother    Depression Mother    Depression Father    Cancer Paternal Uncle        lung CA   Cancer Paternal Grandfather        lung and possibly colon cancer   Breast cancer Neg Hx      Blood pressure/Hypertension: BP Readings from Last 3 Encounters:  09/02/22 110/68  03/26/22 116/74  08/19/21 118/76    Weight/Obesity: Wt Readings from Last 3 Encounters:   09/02/22 186 lb (84.4 kg)  03/26/22 176 lb 4.8 oz (80 kg)  08/19/21 168 lb 9.6 oz (76.5 kg)   BMI Readings from Last 3 Encounters:  09/02/22 29.13 kg/m  03/26/22 27.20 kg/m  08/19/21 25.64 kg/m     Lipids:  Lab Results  Component Value Date   CHOL 171 07/31/2020   Lab Results  Component Value Date   HDL 67 07/31/2020   Lab Results  Component Value Date   LDLCALC 90 07/31/2020   Lab Results  Component Value Date   TRIG 46 07/31/2020   Lab Results  Component Value Date   CHOLHDL 2.6 07/31/2020   No results found for: "LDLDIRECT" Based on the results of lipid panel his/her cardiovascular risk factor ( using [place] )  in the next 10 years is: The ASCVD Risk score (Arnett DK, et al., 2019) failed to calculate for the following reasons:   The 2019 ASCVD risk score is only valid for ages 58 to 33  Glucose:  Glucose  Date Value Ref Range Status  07/03/2014 81 65 - 99 mg/dL Final   Glucose, Bld  Date Value Ref Range Status  07/31/2021 65 65 - 99 mg/dL Final    Comment:    .            Fasting reference interval .   07/31/2020 72 65 - 99 mg/dL Final    Comment:    .            Fasting reference interval .   12/13/2017 85 65 - 99 mg/dL Final    Comment:    .            Fasting reference interval .     Advanced Care Planning:  A voluntary discussion about advance care planning including the explanation and discussion of advance directives.   Discussed health care proxy and Living will, and the patient was able to identify a health care proxy as xavior.   Patient does not have a living will at present time.   Social History       Social History   Socioeconomic History   Marital status: Married    Spouse name: Phillips Odor   Number of children: Not on file   Years of education: 18   Highest education level: Not on file  Occupational History   Occupation: Audiological scientist    Comment: [employer]  Tobacco Use   Smoking status: Never    Smokeless tobacco: Never  Vaping Use   Vaping Use: Never used  Substance and Sexual Activity   Alcohol use: No    Alcohol/week: 0.0 standard drinks of alcohol   Drug use: No   Sexual activity: Yes    Partners: Male  Other Topics Concern   Not on file  Social History Narrative   Not on file   Social Determinants of Health   Financial Resource Strain: Low Risk  (09/02/2022)   Overall Financial Resource Strain (CARDIA)    Difficulty of Paying Living Expenses: Not hard at all  Food Insecurity: No Food Insecurity (09/02/2022)   Hunger Vital Sign    Worried About Running Out of Food in the Last Year: Never true    [employer] in the Last Year: Never true  Transportation Needs: No Transportation Needs (09/02/2022)   PRAPARE - Hydrologist (Medical): No    Lack of Transportation (Non-Medical): No  Physical Activity: Sufficiently Active (09/02/2022)   Exercise Vital Sign    Days of Exercise per Week: 5 days    Minutes of Exercise per Session: 30 min  Stress: No Stress Concern Present (09/02/2022)   [place]    Feeling of Stress : Only a little  Social Connections: Socially Integrated (09/02/2022)   Social Connection and Isolation Panel [  NHANES]    Frequency of Communication with Friends and Family: More than three times a week    Frequency of Social Gatherings with Friends and Family: More than three times a week    Attends Religious Services: 1 to 4 times per year    Active Member of Clubs or Organizations: Yes    Attends Archivist Meetings: 1 to 4 times per year    Marital Status: Married    Family History        Family History  Problem Relation Age of Onset   Hypertension Mother    Depression Mother    Depression Father    Cancer Paternal Uncle        lung CA   Cancer Paternal Grandfather        lung and possibly colon cancer   Breast cancer Neg Hx     Patient Active  Problem List   Diagnosis Date Noted   Multinodular goiter 08/05/2020   Left breast mass 08/02/2016   Positive ANA (antinuclear antibody) 07/22/2016   Fibromyalgia syndrome 09/23/2015   Chronic pelvic pain in female 09/23/2015   Allergic rhinitis with postnasal drip 07/25/2015   Migraine without aura and without status migrainosus, not intractable     Past Surgical History:  Procedure Laterality Date   CESAREAN SECTION N/A 07/13/2019   Procedure: CESAREAN SECTION;  Surgeon: Ward, Honor Loh, MD;  Location: [facility] ORS;  Service: Obstetrics;  Laterality: N/A;     Current Outpatient Medications:    Doxylamine-Pyridoxine (DICLEGIS) 10-10 MG TBEC, Take 1-2 tablets by mouth at bedtime as needed (nausea/vomiting)., Disp: 120 tablet, Rfl: 2   Prenatal Vit-Fe Fumarate-FA (PRENATAL VITAMINS PO), Take by mouth., Disp: , Rfl:    Multiple Vitamin (MULTIVITAMIN) tablet, Take 1 tablet by mouth daily. (Patient not taking: Reported on 09/02/2022), Disp: , Rfl:   Allergies  Allergen Reactions   Shellfish Allergy Other (See Comments)   Apple Juice    Peanuts  [Peanut Oil]     Other reaction(s): SHORTNESS OF BREATH   Shellfish-Derived Products     Other reaction(s): Other (See Comments)    Patient Care Team: [name], PA-C as PCP - General (Family Medicine) Bobetta Lime, MD (Family Medicine)   Chart Review: I personally reviewed active problem list, medication list, allergies, family history, social history, health maintenance, notes from last encounter, lab results, imaging with the patient/caregiver today.   Review of Systems  Constitutional: Negative.   HENT: Negative.    Eyes: Negative.   Respiratory: Negative.    Cardiovascular: Negative.   Gastrointestinal: Negative.   Endocrine: Negative.   Genitourinary: Negative.   Musculoskeletal: Negative.   Skin: Negative.   Allergic/Immunologic: Negative.   Neurological: Negative.   Hematological: Negative.   Psychiatric/Behavioral:  Negative.    All other systems reviewed and are negative.         Objective:   Vitals:  Vitals:   09/02/22 0911  BP: 110/68  Pulse: 96  Resp: 16  Temp: 98 F (36.7 C)  TempSrc: Oral  SpO2: 100%  Weight: 186 lb (84.4 kg)  Height: '5\' 7"'$  (1.702 m)    Body mass index is 29.13 kg/m.  Physical Exam Vitals and nursing note reviewed.  Constitutional:      General: She is not in acute distress.    Appearance: Normal appearance. She is well-developed. She is not ill-appearing, toxic-appearing or diaphoretic.     Interventions: Face mask in place.  HENT:     Head: Normocephalic  and atraumatic.     Right Ear: External ear normal.     Left Ear: External ear normal.  Eyes:     General: Lids are normal. No scleral icterus.       Right eye: No discharge.        Left eye: No discharge.     Conjunctiva/sclera: Conjunctivae normal.  Neck:     Thyroid: Thyromegaly present. No thyroid mass or thyroid tenderness.     Trachea: Trachea and phonation normal. No tracheal deviation.  Cardiovascular:     Rate and Rhythm: Normal rate and regular rhythm.     Pulses: Normal pulses.          Radial pulses are 2+ on the right side and 2+ on the left side.       Posterior tibial pulses are 2+ on the right side and 2+ on the left side.     Heart sounds: Normal heart sounds. No murmur heard.    No friction rub. No gallop.  Pulmonary:     Effort: Pulmonary effort is normal. No respiratory distress.     Breath sounds: Normal breath sounds. No stridor. No wheezing, rhonchi or rales.  Chest:     Chest wall: No tenderness.  Abdominal:     General: Bowel sounds are normal. There is no distension.     Palpations: Abdomen is soft.  Musculoskeletal:     Cervical back: Normal range of motion. No rigidity.     Right lower leg: No edema.     Left lower leg: No edema.  Skin:    General: Skin is warm and dry.     Coloration: Skin is not jaundiced or pale.     Findings: No rash.  Neurological:      Mental Status: She is alert. Mental status is at baseline.     Motor: No abnormal muscle tone.     Gait: Gait normal.  Psychiatric:        Mood and Affect: Mood normal.        Speech: Speech normal.        Behavior: Behavior normal.       Fall Risk:    09/02/2022    9:08 AM 03/26/2022    1:43 PM 08/19/2021    1:46 PM 07/31/2021    9:13 AM 07/31/2020   10:48 AM  Fall Risk   Falls in the past year? 1 0 0 1 0  Number falls in past yr: 0 0 0 0 0  Injury with Fall? 1 0 0 0 0  Risk for fall due to : Impaired balance/gait;Impaired mobility No Fall Risks     Follow up Falls prevention discussed;Education provided;Falls evaluation completed Falls prevention discussed       Functional Status Survey: Is the patient deaf or have difficulty hearing?: No Does the patient have difficulty seeing, even when wearing glasses/contacts?: No Does the patient have difficulty concentrating, remembering, or making decisions?: No Does the patient have difficulty walking or climbing stairs?: No Does the patient have difficulty dressing or bathing?: No Does the patient have difficulty doing errands alone such as visiting a doctor's office or shopping?: No   Assessment & Plan:    CPE completed today  USPSTF grade A and B recommendations reviewed with patient; age-appropriate recommendations, preventive care, screening tests, etc discussed and encouraged; healthy living encouraged; see AVS for patient education given to patient  Discussed importance of 150 minutes of physical activity weekly, AHA exercise recommendations given to  pt in AVS/handout  Discussed importance of healthy diet:  eating lean meats and proteins, avoiding trans fats and saturated fats, avoid simple sugars and excessive carbs in diet, eat 6 servings of fruit/vegetables daily and drink plenty of water and avoid sweet beverages.    Recommended pt to do annual eye exam and routine dental exams/cleanings  Depression, alcohol, fall  screening completed as documented above and per flowsheets  Advance Care planning information and packet discussed and offered today, encouraged pt to discuss with family members/spouse/partner/friends and complete Advanced directive packet and bring copy to office   Reviewed Health Maintenance: Health Maintenance  Topic Date Due   PAP SMEAR-Modifier  12/27/2021   TETANUS/TDAP  04/15/2029   Hepatitis C Screening  Completed   HIV Screening  Completed   HPV VACCINES  Aged Out   INFLUENZA VACCINE  Discontinued   COVID-19 Vaccine  Discontinued    Immunizations: Immunization History  Administered Date(s) Administered   Tdap 04/16/2019   Vaccines:  HPV: up to at age 88 , ask insurance if age between 56-45  Shingrix: 53-64 yo and ask insurance if covered when patient above 41 yo Pneumonia:  educated and discussed with patient. Flu:  educated and discussed with patient. COVID:      ICD-10-CM   1. Annual physical exam  Z00.00 CBC with Differential/Platelet    Lipid panel    Comprehensive metabolic panel    2. Screening for malignant neoplasm of cervix  Z12.4    prefers to do cotesting and f/up with OBGYN - abstracted last pap and HPV, 5 year f/up due by 01/06/2024    3. Need for influenza vaccination  Z23    refused    4. Bleeding in early pregnancy  O20.9 Human Chorionic Gonadotropin (hCG),Quantitative (Serial Monitor)    Human Chorionic Gonadotropin (hCG),Quantitative (Serial Monitor)   scant blood tinge in clear vaginal discharge, she requests quant HCG, will do today and saturday to trend, ER if pain or increased bleeding    5. Iron deficiency anemia, unspecified iron deficiency anemia type  D50.9 CBC with Differential/Platelet    Iron, TIBC and Ferritin Panel   needs f/up labs and if still anemic needs work up for anemia and iron deficiency    6. Multinodular goiter  E04.2 Thyroid Panel With TSH   recheck TSH and [place], no tenderness/masses or sx of hyper or hypothyroid     7. Mass of left breast, unspecified quadrant  N63.20    well overdue for f/up diagnostic mammo and [place] (due since 2017) will defer for now due to pregnancy      Close f/up on labs and sx over weekend She was urged to go to the ER or call 911 with any severe abd pain or heavy bleeding =- esp with hx of anemia, last Hgb 9 - explained not much reserve and she will need to be checked or may need blood transfusion if she looses blood She has Monday OBGYN US - and will follow with them     [name], PA-C 09/02/22 9:38 AM  [facility]

## 2022-09-03 LAB — COMPREHENSIVE METABOLIC PANEL
ALT: 11 IU/L (ref 0–32)
AST: 14 IU/L (ref 0–40)
Albumin/Globulin Ratio: 1.4 (ref 1.2–2.2)
Albumin: 4.3 g/dL (ref 3.9–4.9)
Alkaline Phosphatase: 52 IU/L (ref 44–121)
BUN/Creatinine Ratio: 11 (ref 9–23)
BUN: 7 mg/dL (ref 6–20)
Bilirubin Total: 0.3 mg/dL (ref 0.0–1.2)
CO2: 21 mmol/L (ref 20–29)
Calcium: 9.3 mg/dL (ref 8.7–10.2)
Chloride: 102 mmol/L (ref 96–106)
Creatinine, Ser: 0.64 mg/dL (ref 0.57–1.00)
Globulin, Total: 3.1 g/dL (ref 1.5–4.5)
Glucose: 74 mg/dL (ref 70–99)
Potassium: 4.3 mmol/L (ref 3.5–5.2)
Sodium: 136 mmol/L (ref 134–144)
Total Protein: 7.4 g/dL (ref 6.0–8.5)
eGFR: 117 mL/min/{1.73_m2} (ref >59)

## 2022-09-03 LAB — CBC WITH DIFFERENTIAL/PLATELET
Basophils Absolute: 0 10*3/uL (ref 0.0–0.2)
Basos: 0 %
EOS (ABSOLUTE): 0.1 10*3/uL (ref 0.0–0.4)
Eos: 1 %
Hematocrit: 34.3 % (ref 34.0–46.6)
Hemoglobin: 11 g/dL — ABNORMAL LOW (ref 11.1–15.9)
Immature Grans (Abs): 0 10*3/uL (ref 0.0–0.1)
Immature Granulocytes: 0 %
Lymphocytes Absolute: 1.8 10*3/uL (ref 0.7–3.1)
Lymphs: 24 %
MCH: 28.6 pg (ref 26.6–33.0)
MCHC: 32.1 g/dL (ref 31.5–35.7)
MCV: 89 fL (ref 79–97)
Monocytes Absolute: 0.4 10*3/uL (ref 0.1–0.9)
Monocytes: 6 %
Neutrophils Absolute: 5.2 10*3/uL (ref 1.4–7.0)
Neutrophils: 69 %
Platelets: 325 10*3/uL (ref 150–450)
RBC: 3.85 x10E6/uL (ref 3.77–5.28)
RDW: 14.8 % (ref 11.7–15.4)
WBC: 7.6 10*3/uL (ref 3.4–10.8)

## 2022-09-03 LAB — LIPID PANEL
Chol/HDL Ratio: 2.5 ratio (ref 0.0–4.4)
Cholesterol, Total: 163 mg/dL (ref 100–199)
HDL: 65 mg/dL (ref >39)
LDL Chol Calc (NIH): 88 mg/dL (ref 0–99)
Triglycerides: 48 mg/dL (ref 0–149)
VLDL Cholesterol Cal: 10 mg/dL (ref 5–40)

## 2022-09-03 LAB — THYROID PANEL WITH TSH
Free Thyroxine Index: 2.3 (ref 1.2–4.9)
T3 Uptake Ratio: 24 % (ref 24–39)
T4, Total: 9.4 ug/dL (ref 4.5–12.0)
TSH: 0.778 u[IU]/mL (ref 0.450–4.500)

## 2022-09-03 LAB — IRON,TIBC AND FERRITIN PANEL
Ferritin: 15 ng/mL (ref 15–150)
Iron Saturation: 32 % (ref 15–55)
Iron: 129 ug/dL (ref 27–159)
Total Iron Binding Capacity: 407 ug/dL (ref 250–450)
UIBC: 278 ug/dL (ref 131–425)

## 2022-09-03 LAB — HUMAN CHORIONIC GONADOTROPIN(HCG),B-SUBUNIT,QUANTITATIVE): HCG, Beta Chain, Quant, S: 68174 m[IU]/mL

## 2022-10-06 ENCOUNTER — Other Ambulatory Visit (HOSPITAL_COMMUNITY)
Admission: RE | Admit: 2022-10-06 | Discharge: 2022-10-06 | Disposition: A | Discharge status: Home / Self Care | Payer: 59 | Source: Ambulatory Visit | Attending: Family Medicine | Admitting: Family Medicine

## 2022-10-06 ENCOUNTER — Encounter: Payer: Self-pay | Admitting: Family Medicine

## 2022-10-06 ENCOUNTER — Ambulatory Visit (INDEPENDENT_AMBULATORY_CARE_PROVIDER_SITE_OTHER): Payer: 59 | Admitting: Family Medicine

## 2022-10-06 VITALS — BP 121/77 | HR 98 | Wt 189.0 lb

## 2022-10-06 DIAGNOSIS — N6320 Unspecified lump in the left breast, unspecified quadrant: Secondary | ICD-10-CM

## 2022-10-06 DIAGNOSIS — O09521 Supervision of elderly multigravida, first trimester: Secondary | ICD-10-CM | POA: Diagnosis not present

## 2022-10-06 DIAGNOSIS — Z124 Encounter for screening for malignant neoplasm of cervix: Secondary | ICD-10-CM

## 2022-10-06 DIAGNOSIS — O34219 Maternal care for unspecified type scar from previous cesarean delivery: Secondary | ICD-10-CM | POA: Diagnosis not present

## 2022-10-06 DIAGNOSIS — Z3A12 12 weeks gestation of pregnancy: Secondary | ICD-10-CM

## 2022-10-06 DIAGNOSIS — O0991 Supervision of high risk pregnancy, unspecified, first trimester: Secondary | ICD-10-CM

## 2022-10-06 DIAGNOSIS — O09529 Supervision of elderly multigravida, unspecified trimester: Secondary | ICD-10-CM | POA: Insufficient documentation

## 2022-10-06 DIAGNOSIS — O09522 Supervision of elderly multigravida, second trimester: Secondary | ICD-10-CM

## 2022-10-06 DIAGNOSIS — O0990 Supervision of high risk pregnancy, unspecified, unspecified trimester: Secondary | ICD-10-CM | POA: Insufficient documentation

## 2022-10-06 NOTE — Progress Notes (Signed)
Subjective:   Beth Cox is a 36 y.o. G3P1011 at 50w2dby early ultrasound being seen today for her first obstetrical visit.  Her obstetrical history is significant for advanced maternal age and previous cesarean section. Patient does not know if she intends to breast feed. Pregnancy history fully reviewed.  Patient reports fatigue and nausea.  HISTORY: OB History  Gravida Para Term Preterm AB Living  '3 1 1 '$ 0 1 1  SAB IAB Ectopic Multiple Live Births  0 0 0 0 1    # Outcome Date GA Lbr Len/2nd Weight Sex Delivery Anes PTL Lv  3 Current           2 AB 05/12/22 146w0d       1 Term 07/13/19 [id number]:01 / 01:12 6 lb 10.9 oz (3.03 kg) M CS-LTranv EPI, Local  LIV     Name: Langworthy,BOY [place]     Apgar1: 1  Apgar5: 2   Last pap smear was  2020 and was normal Past Medical History:  Diagnosis Date   Allergy    Anxiety, mild 06/04/2015   Fibromyalgia syndrome 09/23/2015   Migraine    Multinodular goiter 08/05/2020   Past Surgical History:  Procedure Laterality Date   CESAREAN SECTION N/A 07/13/2019   Procedure: CESAREAN SECTION;  Surgeon: Ward, CheHonor LohD;  Location: [facility] ORS;  Service: Obstetrics;  Laterality: N/A;   Family History  Problem Relation Age of Onset   Hypertension Mother    Depression Mother    Depression Father    Cancer Paternal Uncle        lung CA   Cancer Paternal Grandfather        lung and possibly colon cancer   Breast cancer Neg Hx    Social History   Tobacco Use   Smoking status: Never   Smokeless tobacco: Never  Vaping Use   Vaping Use: Never used  Substance Use Topics   Alcohol use: No    Alcohol/week: 0.0 standard drinks of alcohol   Drug use: No   Allergies  Allergen Reactions   Shellfish Allergy Other (See Comments)   Apple Juice    Peanuts  [Peanut Oil]     Other reaction(s): SHORTNESS OF BREATH   Current Outpatient Medications on File Prior to Visit  Medication Sig Dispense Refill   Doxylamine-Pyridoxine (DICLEGIS)  10-10 MG TBEC Take 1-2 tablets by mouth at bedtime as needed (nausea/vomiting). 120 tablet 2   Prenatal Vit-Fe Fumarate-FA (PRENATAL VITAMINS PO) Take by mouth.     No current facility-administered medications on file prior to visit.     Exam   Vitals:   10/06/22 0936  BP: 121/77  Pulse: 98  Weight: 189 lb (85.7 kg)   Fetal Heart Rate (bpm): 160  Uterus:   12 weeks size  Pelvic Exam: Perineum: no hemorrhoids, normal perineum   Vulva: normal external genitalia, no lesions   Vagina:  normal mucosa, normal discharge   Cervix: no lesions and normal, pap smear done.    Bony Pelvis: average  System: General: well-developed, well-nourished female in no acute distress   Breast:  normal appearance, no masses or tenderness   Skin: normal coloration and turgor, no rashes   Neurologic: oriented, normal, negative, normal mood   Extremities: normal strength, tone, and muscle mass, ROM of all joints is normal   HEENT PERRLA, extraocular movement intact and sclera clear, anicteric   Mouth/Teeth mucous membranes moist, pharynx normal without lesions  and dental hygiene good   Neck supple and no masses   Cardiovascular: regular rate and rhythm   Respiratory:  no respiratory distress, normal breath sounds   Abdomen: soft, non-tender; bowel sounds normal; no masses,  no organomegaly     Assessment:   Pregnancy: G3P1011 Patient Active Problem List   Diagnosis Date Noted   Supervision of high risk pregnancy, antepartum 10/06/2022   Previous cesarean delivery affecting pregnancy, antepartum 10/06/2022   AMA (advanced maternal age) multigravida 35+ 10/06/2022   Multinodular goiter 08/05/2020   Left breast mass 08/02/2016   Positive ANA (antinuclear antibody) 07/22/2016   Fibromyalgia syndrome 09/23/2015   Chronic pelvic pain in female 09/23/2015   Allergic rhinitis with postnasal drip 07/25/2015   Migraine without aura and without status migrainosus, not intractable      Plan:  1.  Supervision of high risk pregnancy, antepartum New OB labs today - CBC/D/Plt+RPR+Rh+ABO+RubIgG... - Culture, OB Urine - Hemoglobin A1c - [place] MFM OB DETAIL +14 WK; Future - [facility] CUSTOM - PANORAMA PRENATAL TEST FULL PANEL  2. Screening for cervical cancer - Cytology - PAP  3. Previous cesarean delivery affecting pregnancy, antepartum Undecided but leaning toward RCS  4. Multigravida of advanced maternal age in second trimester NIPT today  5. Mass of left breast, unspecified quadrant Per mammogram possibly a fibroadenoma--f/u pp   Initial labs drawn. Continue prenatal vitamins. Genetic Screening discussed, NIPS: ordered. Ultrasound discussed; fetal anatomic survey: ordered. Problem list reviewed and updated. The nature of [facility] with multiple MDs and other Advanced Practice Providers was explained to patient; also emphasized that residents, students are part of our team. Routine obstetric precautions reviewed. Return in 4 weeks (on 11/03/2022).

## 2022-10-06 NOTE — Progress Notes (Signed)
Transfer NOB Patient.  LMP:07/16/2022 EDD 04/21/22 U/S : 09/06/22 EDD 04/17/22 per pt was told to go with 04/17/22. Last Pap:01/05/2019 WNL  Genetic Screening: Desires does not want to know Gender will wait at anatomy U/S.   Pt had miscarriage in May 2023.  CC: Nausea and vomiting.

## 2022-10-07 LAB — CBC/D/PLT+RPR+RH+ABO+RUBIGG...
Antibody Screen: NEGATIVE
Basophils Absolute: 0 10*3/uL (ref 0.0–0.2)
Basos: 0 %
EOS (ABSOLUTE): 0.1 10*3/uL (ref 0.0–0.4)
Eos: 1 %
HCV Ab: NONREACTIVE
HIV Screen 4th Generation wRfx: NONREACTIVE
Hematocrit: 39.7 % (ref 34.0–46.6)
Hemoglobin: 13.1 g/dL (ref 11.1–15.9)
Hepatitis B Surface Ag: NEGATIVE
Immature Grans (Abs): 0 10*3/uL (ref 0.0–0.1)
Immature Granulocytes: 0 %
Lymphocytes Absolute: 2.3 10*3/uL (ref 0.7–3.1)
Lymphs: 19 %
MCH: 29.1 pg (ref 26.6–33.0)
MCHC: 33 g/dL (ref 31.5–35.7)
MCV: 88 fL (ref 79–97)
Monocytes Absolute: 0.6 10*3/uL (ref 0.1–0.9)
Monocytes: 5 %
Neutrophils Absolute: 8.8 10*3/uL — ABNORMAL HIGH (ref 1.4–7.0)
Neutrophils: 75 %
Platelets: 332 10*3/uL (ref 150–450)
RBC: 4.5 x10E6/uL (ref 3.77–5.28)
RDW: 15.7 % — ABNORMAL HIGH (ref 11.7–15.4)
RPR Ser Ql: NONREACTIVE
Rh Factor: POSITIVE
Rubella Antibodies, IGG: 2.81 index (ref >0.99)
WBC: 11.9 10*3/uL — ABNORMAL HIGH (ref 3.4–10.8)

## 2022-10-07 LAB — HCV INTERPRETATION

## 2022-10-07 LAB — HEMOGLOBIN A1C
Est. average glucose Bld gHb Est-mCnc: 105 mg/dL
Hgb A1c MFr Bld: 5.3 % (ref 4.8–5.6)

## 2022-10-08 LAB — CULTURE, OB URINE

## 2022-10-08 LAB — URINE CULTURE, OB REFLEX

## 2022-10-12 LAB — CYTOLOGY - PAP
Chlamydia: NEGATIVE
Comment: NEGATIVE
Comment: NEGATIVE
Comment: NORMAL
Diagnosis: NEGATIVE
High risk HPV: NEGATIVE
Neisseria Gonorrhea: NEGATIVE

## 2022-10-13 LAB — PANORAMA PRENATAL TEST FULL PANEL:PANORAMA TEST PLUS 5 ADDITIONAL MICRODELETIONS: FETAL FRACTION: 7.9

## 2022-10-19 LAB — HORIZON CUSTOM: REPORT SUMMARY: NEGATIVE

## 2022-11-04 ENCOUNTER — Ambulatory Visit (INDEPENDENT_AMBULATORY_CARE_PROVIDER_SITE_OTHER): Payer: 59 | Admitting: Obstetrics and Gynecology

## 2022-11-04 VITALS — BP 121/78 | HR 112 | Wt 199.0 lb

## 2022-11-04 DIAGNOSIS — R768 Other specified abnormal immunological findings in serum: Secondary | ICD-10-CM

## 2022-11-04 DIAGNOSIS — N6320 Unspecified lump in the left breast, unspecified quadrant: Secondary | ICD-10-CM

## 2022-11-04 DIAGNOSIS — O34219 Maternal care for unspecified type scar from previous cesarean delivery: Secondary | ICD-10-CM

## 2022-11-04 DIAGNOSIS — O219 Vomiting of pregnancy, unspecified: Secondary | ICD-10-CM

## 2022-11-04 DIAGNOSIS — O09522 Supervision of elderly multigravida, second trimester: Secondary | ICD-10-CM

## 2022-11-04 DIAGNOSIS — E042 Nontoxic multinodular goiter: Secondary | ICD-10-CM

## 2022-11-04 MED ORDER — PROMETHAZINE HCL 25 MG PO TABS: 25.0000 mg | ORAL_TABLET | Freq: Four times a day (QID) | ORAL | 2 refills | Status: DC | PRN

## 2022-11-04 NOTE — Progress Notes (Signed)
   PRENATAL VISIT NOTE  Subjective:  Beth Cox is a 36 y.o. G3P1011 at 51w3dbeing seen today for ongoing prenatal care.  She is currently monitored for the following issues for this high-risk pregnancy and has Migraine without aura and without status migrainosus, not intractable; Allergic rhinitis with postnasal drip; Fibromyalgia syndrome; Chronic pelvic pain in female; Positive ANA (antinuclear antibody); Left breast mass; Multinodular goiter; Supervision of high risk pregnancy, antepartum; Previous cesarean delivery affecting pregnancy, antepartum; and AMA (advanced maternal age) multigravida 35+ on their problem list.  Patient reports no complaints.  Contractions: Not present. Vag. Bleeding: None.  Movement: Present. Denies leaking of fluid.   The following portions of the patient's history were reviewed and updated as appropriate: allergies, current medications, past family history, past medical history, past social history, past surgical history and problem list.   Objective:   Vitals:   11/04/22 0937  BP: 121/78  Pulse: (!) 112  Weight: 199 lb (90.3 kg)    Fetal Status: Fetal Heart Rate (bpm): 148   Movement: Present     General:  Alert, oriented and cooperative. Patient is in no acute distress.  Skin: Skin is warm and dry. No rash noted.   Cardiovascular: Normal heart rate noted  Respiratory: Normal respiratory effort, no problems with respiration noted  Abdomen: Soft, gravid, appropriate for gestational age.  Pain/Pressure: Present     Pelvic: Cervical exam deferred        Extremities: Normal range of motion.     Mental Status: Normal mood and affect. Normal behavior. Normal judgment and thought content.   Assessment and Plan:  Pregnancy: G3P1011 at 155w3d. Nausea and vomiting during pregnancy Has some GERD. Currently only on diclegis. Pepcid sent in and PRN phenergan. 23lbs total weight gain  2. Previous cesarean delivery affecting pregnancy, antepartum 2020  stat LTCS for cord prolapse. Pt leaning towards rpt c/s. Can d/w her more at 28wks  3. Multigravida of advanced maternal age in second trimester Low risk  4. Multinodular goiter Neg TFTs 08/2022  5. Positive ANA (antinuclear antibody) Neg rheum eval 2017  6. Mass of left breast, unspecified quadrant Neg exam 09/2022. Consider post pregnancy mammogram  Preterm labor symptoms and general obstetric precautions including but not limited to vaginal bleeding, contractions, leaking of fluid and fetal movement were reviewed in detail with the patient. Please refer to After Visit Summary for other counseling recommendations.   No follow-ups on file.  Future Appointments  Date Time Provider DeBrookville11/28/2023  9:30 AM [facility]11/28/2023  9:45 AM [facility] US5 [facility]12/06/2022  9:35 AM Anyanwu, UgSallyanne HaversMD [facility]  03/10/2023  8:40 AM TaDelsa GranaPA-C [facility] PEC    ChAletha HalimMD

## 2022-11-23 ENCOUNTER — Ambulatory Visit: Payer: 59

## 2022-11-23 ENCOUNTER — Ambulatory Visit: Payer: 59 | Attending: Family Medicine

## 2022-11-23 ENCOUNTER — Other Ambulatory Visit: Payer: Self-pay

## 2022-11-23 VITALS — BP 120/64 | HR 101

## 2022-11-23 DIAGNOSIS — O0990 Supervision of high risk pregnancy, unspecified, unspecified trimester: Secondary | ICD-10-CM

## 2022-11-23 DIAGNOSIS — O09522 Supervision of elderly multigravida, second trimester: Secondary | ICD-10-CM | POA: Diagnosis present

## 2022-11-23 DIAGNOSIS — O34219 Maternal care for unspecified type scar from previous cesarean delivery: Secondary | ICD-10-CM | POA: Insufficient documentation

## 2022-12-02 ENCOUNTER — Ambulatory Visit (INDEPENDENT_AMBULATORY_CARE_PROVIDER_SITE_OTHER): Payer: 59 | Admitting: Obstetrics & Gynecology

## 2022-12-02 ENCOUNTER — Other Ambulatory Visit (HOSPITAL_COMMUNITY)
Admission: RE | Admit: 2022-12-02 | Discharge: 2022-12-02 | Disposition: A | Discharge status: Home / Self Care | Payer: 59 | Source: Ambulatory Visit | Attending: Obstetrics & Gynecology | Admitting: Obstetrics & Gynecology

## 2022-12-02 VITALS — BP 118/78 | HR 116 | Wt 207.0 lb

## 2022-12-02 DIAGNOSIS — N898 Other specified noninflammatory disorders of vagina: Secondary | ICD-10-CM | POA: Diagnosis present

## 2022-12-02 DIAGNOSIS — O26892 Other specified pregnancy related conditions, second trimester: Secondary | ICD-10-CM

## 2022-12-02 DIAGNOSIS — O0990 Supervision of high risk pregnancy, unspecified, unspecified trimester: Secondary | ICD-10-CM

## 2022-12-02 DIAGNOSIS — Z3A20 20 weeks gestation of pregnancy: Secondary | ICD-10-CM

## 2022-12-02 DIAGNOSIS — O34219 Maternal care for unspecified type scar from previous cesarean delivery: Secondary | ICD-10-CM

## 2022-12-02 DIAGNOSIS — O0992 Supervision of high risk pregnancy, unspecified, second trimester: Secondary | ICD-10-CM

## 2022-12-02 NOTE — Progress Notes (Signed)
PRENATAL VISIT NOTE  Subjective:  Beth Cox is a 36 y.o. G3P1011 at 25w3dbeing seen today for ongoing prenatal care.  She is currently monitored for the following issues for this high-risk pregnancy and has Migraine without aura and without status migrainosus, not intractable; Allergic rhinitis with postnasal drip; Fibromyalgia syndrome; Chronic pelvic pain in female; Positive ANA (antinuclear antibody); Left breast mass; Multinodular goiter; Supervision of high risk pregnancy, antepartum; Previous cesarean delivery affecting pregnancy, antepartum; and AMA (advanced maternal age) multigravida 35+ on their problem list.  Patient reports vulvar irritation, but wears unscented pantyliners all day due to issues ongoing urinary incontinence when she laughs, sneezes or any activity. Has been ongoing problem since her last pregnancy, worsened during pregnancy.  Negative UTI symptoms (had negative recent culture), denies any vaginal discharge.  Contractions: Irritability. Vag. Bleeding: None.  Movement: Present. Denies leaking of fluid.   The following portions of the patient's history were reviewed and updated as appropriate: allergies, current medications, past family history, past medical history, past social history, past surgical history and problem list.   Objective:   Vitals:   12/02/22 0933  BP: 118/78  Pulse: (!) 116  Weight: 207 lb (93.9 kg)    Fetal Status: Fetal Heart Rate (bpm): 152   Movement: Present     General:  Alert, oriented and cooperative. Patient is in no acute distress.  Skin: Skin is warm and dry. No rash noted.   Cardiovascular: Normal heart rate noted  Respiratory: Normal respiratory effort, no problems with respiration noted  Abdomen: Soft, gravid, appropriate for gestational age.  Pain/Pressure: Present     Pelvic: Cervical exam deferred        Extremities: Normal range of motion.  Edema: None  Mental Status: Normal mood and affect. Normal behavior. Normal  judgment and thought content.   Imaging: UKoreaMFM OB DETAIL +14 WK  Result Date: 11/23/2022 ----------------------------------------------------------------------  OBSTETRICS REPORT                       (Signed Final 11/23/2022 12:09 pm) ---------------------------------------------------------------------- Patient Info  ID #:       [id number]                         D.O.B.:  [id number](36 yrs)  Name:       KSISSY GOETZKE              Visit Date: 11/23/2022 10:45 am ---------------------------------------------------------------------- Performed By  Attending:        VJohnell ComingsMD         Ref. Address:     [address] Performed By:     FBenson [place]         [facility]                                                             [place] [facility]                                                             [facility]  Referred By:      [facility] ---------------------------------------------------------------------- Orders  #  Description                           Code        Ordered By  1  [place] MFM OB DETAIL +14 WK               76811.01    Darron Doom ----------------------------------------------------------------------  #  Order #                     Accession #                Episode #  1  [id number]                   [id number]                 [id number] ---------------------------------------------------------------------- Indications  Advanced maternal age multigravida 60+,        O13.522  second trimester  History of cesarean delivery, currently        O34.219  pregnant  [redacted] weeks gestation of pregnancy                Z3A.19  Encounter for antenatal screening for          Z36.3  malformations  LR NIPS/Neg [place] ---------------------------------------------------------------------- Vital Signs  BP:          120/64  ---------------------------------------------------------------------- Fetal Evaluation  Num Of Fetuses:         1  Fetal Heart Rate(bpm):  158  Cardiac Activity:       Observed  Presentation:           Cephalic  Placenta:               Posterior  P. Cord Insertion:      Visualized, central  Amniotic Fluid  AFI FV:      Within normal limits                              Largest Pocket(cm)                              4.4 ---------------------------------------------------------------------- Biometry  BPD:        46  mm     G. Age:  19w 6d         81  %    CI:        71.24   %    70 - 86  FL/HC:      17.9   %    16.1 - 18.3  HC:      173.6  mm     G. Age:  19w 6d         78  %    HC/AC:      1.13        1.09 - 1.39  AC:      153.1  mm     G. Age:  20w 4d         86  %    FL/BPD:     67.4   %  FL:         31  mm     G. Age:  19w 4d         61  %    FL/AC:      20.2   %    20 - 24  CER:        20  mm     G. Age:  19w 2d         50  %  NFT:       4.3  mm  LV:        7.6  mm  CM:        5.1  mm  Est. FW:     330  gm    0 lb 12 oz      92  % ---------------------------------------------------------------------- OB History  Gravidity:    3         Term:   1  TOP:          1        Living:  1 ---------------------------------------------------------------------- Gestational Age  LMP:           18w 4d        Date:  07/16/22                  EDD:   04/22/23  U/S Today:     20w 0d                                        EDD:   04/12/23  Best:          19w 1d     Det. By:  Early Exam  (09/06/22)   EDD:   04/18/23 ---------------------------------------------------------------------- Anatomy  Cranium:               Appears normal         Aortic Arch:            Appears normal  Cavum:                 Appears normal         Ductal Arch:            Appears normal  Ventricles:            Appears normal         Diaphragm:              Appears normal  Choroid Plexus:        Appears  normal         Stomach:                Appears normal, left  sided  Cerebellum:            Appears normal         Abdomen:                Appears normal  Posterior Fossa:       Appears normal         Abdominal Wall:         Appears nml (cord                                                                        insert, abd wall)  Nuchal Fold:           Appears normal         Cord Vessels:           Appears normal (3                                                                        vessel cord)  Face:                  Appears normal         Kidneys:                Appear normal                         (orbits and profile)  Lips:                  Appears normal         Bladder:                Appears normal  Thoracic:              Appears normal         Spine:                  Not well visualized  Heart:                 Appears normal         Upper Extremities:      Appears normal                         (4CH, axis, and                         situs)  RVOT:                  Appears normal         Lower Extremities:      Appears normal  LVOT:                  Appears normal  Other:  Fetus appears to be a female. Nasal bone, lenses, maxilla, mandible          and falx visualized.  VC, 3VV and 3VTV visualized. Hands and feet          visualized. ---------------------------------------------------------------------- Cervix Uterus Adnexa  Cervix  Length:            3.8  cm.  Closed Normal appearance by transabdominal scan.  Adnexa  No abnormality visualized. ---------------------------------------------------------------------- Comments  This patient was seen for a detailed fetal anatomy scan due  to advanced maternal age.  She denies any significant past medical history and denies  any problems in her current pregnancy.  She had a cell free DNA test earlier in her pregnancy which  indicated a low risk for trisomy 76, 67, and 13. A female fetus is   predicted.  She was informed that the fetal growth and amniotic fluid  level were appropriate for her gestational age.  There were no obvious fetal anomalies noted on today's  ultrasound exam.  The patient was informed that anomalies may be missed due  to technical limitations. If the fetus is in a suboptimal position  or maternal habitus is increased, visualization of the fetus in  the maternal uterus may be impaired.  The increased risk of fetal aneuploidy due to advanced  maternal age was discussed. Due to advanced maternal age,  the patient was offered and declined an amniocentesis today  for definitive diagnosis of fetal aneuploidy.  She is  comfortable with her negative cell free DNA test.  A follow-up exam was scheduled in 4 weeks to complete the  views of the fetal anatomy which were limited today due to  the fetal position. ----------------------------------------------------------------------                   Johnell Comings, MD Electronically Signed Final Report   11/23/2022 12:09 pm ----------------------------------------------------------------------   Assessment and Plan:  Pregnancy: G3P1011 at 86w3d1. Vulvar irritation during pregnancy in second trimester Likely contact dermatitis, but will evaluate for vulvovaginitis pathogens.  Proper vulvar hygiene emphasized.  - Cervicovaginal ancillary only  2. Previous cesarean delivery affecting pregnancy, antepartum Will discuss during 28 week visit.  3. [redacted] weeks gestation of pregnancy 4. Supervision of high risk pregnancy, antepartum Anatomy follow up ordered, offered AFP to patient given incomplete spine views, but she declined.  Preterm labor symptoms and general obstetric precautions including but not limited to vaginal bleeding, contractions, leaking of fluid and fetal movement were reviewed in detail with the patient. Please refer to After Visit Summary for other counseling recommendations.   Return in about 4 weeks (around 12/30/2022) for  OFFICE OB VISIT (MD or APP).  Future Appointments  Date Time Provider DWaial[place] 12/22/2022  3:15 PM [facility] 12/22/2022  3:30 PM [facility] US2 [facility] 12/30/2022 10:55 AM PAletha Halim MD [facility]  01/26/2023  8:30 AM CWH-WSCA LAB [facility]  01/26/2023  8:55 AM PDonnamae Jude MD [facility]  03/10/2023  8:40 AM TDelsa Grana PA-C CManhattanPEC    UVerita Schneiders MD

## 2022-12-03 LAB — CERVICOVAGINAL ANCILLARY ONLY
Bacterial Vaginitis (gardnerella): NEGATIVE
Candida Glabrata: NEGATIVE
Candida Vaginitis: NEGATIVE
Comment: NEGATIVE
Comment: NEGATIVE
Comment: NEGATIVE

## 2022-12-22 ENCOUNTER — Ambulatory Visit: Payer: 59 | Admitting: *Deleted

## 2022-12-22 ENCOUNTER — Ambulatory Visit: Payer: 59 | Attending: Obstetrics

## 2022-12-22 VITALS — BP 117/65 | HR 101

## 2022-12-22 DIAGNOSIS — O0990 Supervision of high risk pregnancy, unspecified, unspecified trimester: Secondary | ICD-10-CM

## 2022-12-22 DIAGNOSIS — O34219 Maternal care for unspecified type scar from previous cesarean delivery: Secondary | ICD-10-CM | POA: Insufficient documentation

## 2022-12-22 DIAGNOSIS — O09522 Supervision of elderly multigravida, second trimester: Secondary | ICD-10-CM

## 2022-12-22 DIAGNOSIS — Z3A23 23 weeks gestation of pregnancy: Secondary | ICD-10-CM

## 2022-12-23 ENCOUNTER — Other Ambulatory Visit: Payer: Self-pay | Admitting: *Deleted

## 2022-12-23 DIAGNOSIS — O09522 Supervision of elderly multigravida, second trimester: Secondary | ICD-10-CM

## 2022-12-23 DIAGNOSIS — O34219 Maternal care for unspecified type scar from previous cesarean delivery: Secondary | ICD-10-CM

## 2022-12-30 ENCOUNTER — Ambulatory Visit (INDEPENDENT_AMBULATORY_CARE_PROVIDER_SITE_OTHER): Payer: BC Managed Care – PPO | Admitting: Obstetrics and Gynecology

## 2022-12-30 VITALS — BP 116/76 | HR 106 | Wt 217.0 lb

## 2022-12-30 DIAGNOSIS — O3662X0 Maternal care for excessive fetal growth, second trimester, not applicable or unspecified: Secondary | ICD-10-CM

## 2022-12-30 DIAGNOSIS — Z3A24 24 weeks gestation of pregnancy: Secondary | ICD-10-CM

## 2022-12-30 DIAGNOSIS — O34219 Maternal care for unspecified type scar from previous cesarean delivery: Secondary | ICD-10-CM

## 2022-12-30 DIAGNOSIS — O0992 Supervision of high risk pregnancy, unspecified, second trimester: Secondary | ICD-10-CM

## 2022-12-30 DIAGNOSIS — O09522 Supervision of elderly multigravida, second trimester: Secondary | ICD-10-CM

## 2022-12-30 DIAGNOSIS — O0990 Supervision of high risk pregnancy, unspecified, unspecified trimester: Secondary | ICD-10-CM

## 2022-12-30 NOTE — Progress Notes (Addendum)
   PRENATAL VISIT NOTE  Subjective:  Beth Cox is a 37 y.o. G3P1011 at 70w3dbeing seen today for ongoing prenatal care.  She is currently monitored for the following issues for this high-risk pregnancy and has Migraine without aura and without status migrainosus, not intractable; Allergic rhinitis with postnasal drip; Fibromyalgia syndrome; Chronic pelvic pain in female; Positive ANA (antinuclear antibody); Left breast mass; Multinodular goiter; Supervision of high risk pregnancy, antepartum; Previous cesarean delivery affecting pregnancy, antepartum; and AMA (advanced maternal age) multigravida 35+ on their problem list.  Patient reports  constipation, lethargy .  Contractions: Not present. Vag. Bleeding: None.  Movement: Present. Denies leaking of fluid.   The following portions of the patient's history were reviewed and updated as appropriate: allergies, current medications, past family history, past medical history, past social history, past surgical history and problem list.   Objective:   Vitals:   12/30/22 1113  BP: 116/76  Pulse: (!) 106  Weight: 217 lb (98.4 kg)    Fetal Status: Fetal Heart Rate (bpm): 154   Movement: Present     General:  Alert, oriented and cooperative. Patient is in no acute distress.  Skin: Skin is warm and dry. No rash noted.   Cardiovascular: Normal heart rate noted  Respiratory: Normal respiratory effort, no problems with respiration noted  Abdomen: Soft, gravid, appropriate for gestational age.  Pain/Pressure: Present     Pelvic: Cervical exam deferred        Extremities: Normal range of motion.  Edema: Trace  Mental Status: Normal mood and affect. Normal behavior. Normal judgment and thought content.   Assessment and Plan:  Pregnancy: G3P1011 at 268w3d. Supervision of high risk pregnancy, antepartum GTT next visit  2. [redacted] weeks gestation of pregnancy D/w her strategies for constipation and to try and get more sleep (about  5-6h/night)  3. Multigravida of advanced maternal age in second trimester No issues. F/u serial growth u/s. LGA on last growth  4. Previous cesarean delivery affecting pregnancy, antepartum D/w pt more re: delivery more next visit 2020 stat LTCS for cord prolapse.   Preterm labor symptoms and general obstetric precautions including but not limited to vaginal bleeding, contractions, leaking of fluid and fetal movement were reviewed in detail with the patient. Please refer to After Visit Summary for other counseling recommendations.   No follow-ups on file.  Future Appointments  Date Time Provider DeMay1/31/2024  8:30 AM [facility] LAB [facility]  01/26/2023  8:55 AM PrDonnamae JudeMD [facility]  01/27/2023  9:15 AM [facility] NURSE [facility]2/12/2022  9:30 AM [facility]-MFC US3 [facility]/14/2024  8:40 AM TaDelsa GranaPA-C [facility] P[facility]    ChAletha HalimMD

## 2022-12-30 NOTE — Progress Notes (Signed)
ROB [redacted]w[redacted]d CC:

## 2022-12-30 NOTE — Progress Notes (Signed)
ROB   CC: Stomach feels very heavy and tight at times.  Pt notes constipation pt may have BM 4-5 times a week and fatigue.   notes Hx of Anemia, Taking One a Day Advanced Heartburn.

## 2022-12-30 NOTE — Patient Instructions (Signed)
Metamucil Miralax

## 2023-01-17 ENCOUNTER — Encounter: Payer: Self-pay | Admitting: Obstetrics and Gynecology

## 2023-01-17 ENCOUNTER — Other Ambulatory Visit: Payer: Self-pay | Admitting: *Deleted

## 2023-01-17 MED ORDER — PANTOPRAZOLE SODIUM 40 MG PO TBEC: 40.0000 mg | DELAYED_RELEASE_TABLET | Freq: Every day | ORAL | 3 refills | Status: DC

## 2023-01-26 ENCOUNTER — Other Ambulatory Visit: Payer: BC Managed Care – PPO

## 2023-01-26 ENCOUNTER — Ambulatory Visit: Payer: BC Managed Care – PPO | Admitting: Family Medicine

## 2023-01-26 VITALS — BP 117/74 | HR 111 | Wt 224.4 lb

## 2023-01-26 DIAGNOSIS — O0990 Supervision of high risk pregnancy, unspecified, unspecified trimester: Secondary | ICD-10-CM | POA: Diagnosis not present

## 2023-01-26 DIAGNOSIS — O34219 Maternal care for unspecified type scar from previous cesarean delivery: Secondary | ICD-10-CM

## 2023-01-26 DIAGNOSIS — O09523 Supervision of elderly multigravida, third trimester: Secondary | ICD-10-CM

## 2023-01-26 DIAGNOSIS — Z3A28 28 weeks gestation of pregnancy: Secondary | ICD-10-CM

## 2023-01-26 NOTE — Progress Notes (Signed)
TDAP next visit

## 2023-01-26 NOTE — Progress Notes (Signed)
    PRENATAL VISIT NOTE  Subjective:  MERCEDES FORT is a 37 y.o. G3P1011 at 74w2dbeing seen today for ongoing prenatal care.  She is currently monitored for the following issues for this low-risk pregnancy and has Migraine without aura and without status migrainosus, not intractable; Allergic rhinitis with postnasal drip; Fibromyalgia syndrome; Chronic pelvic pain in female; Positive ANA (antinuclear antibody); Left breast mass; Multinodular goiter; Supervision of high risk pregnancy, antepartum; Previous cesarean delivery affecting pregnancy, antepartum; and AMA (advanced maternal age) multigravida 35+ on their problem list.  Patient reports no complaints.  Contractions: Irregular. Vag. Bleeding: None.  Movement: Present. Denies leaking of fluid.   The following portions of the patient's history were reviewed and updated as appropriate: allergies, current medications, past family history, past medical history, past social history, past surgical history and problem list.   Objective:   Vitals:   01/26/23 0904  BP: 117/74  Pulse: (!) 111  Weight: 224 lb 6.4 oz (101.8 kg)    Fetal Status: Fetal Heart Rate (bpm): 152   Movement: Present     General:  Alert, oriented and cooperative. Patient is in no acute distress.  Skin: Skin is warm and dry. No rash noted.   Cardiovascular: Normal heart rate noted  Respiratory: Normal respiratory effort, no problems with respiration noted  Abdomen: Soft, gravid, appropriate for gestational age.  Pain/Pressure: Present     Pelvic: Cervical exam deferred        Extremities: Normal range of motion.  Edema: None  Mental Status: Normal mood and affect. Normal behavior. Normal judgment and thought content.   Assessment and Plan:  Pregnancy: G3P1011 at 270w2d. Supervision of high risk pregnancy, antepartum Continue routine prenatal care. 28 wk labs today TDaP next visit  2. Previous cesarean delivery affecting pregnancy, antepartum Discussion  about risks and benefits. She is still considering. Baby is LGA @ > 93% and she is considering BTL--to discuss further as she gets more information.  3. Multigravida of advanced maternal age in third trimester LR NIPT  Preterm labor symptoms and general obstetric precautions including but not limited to vaginal bleeding, contractions, leaking of fluid and fetal movement were reviewed in detail with the patient. Please refer to After Visit Summary for other counseling recommendations.   Return in 2 weeks (on 02/09/2023).  Future Appointments  Date Time Provider DePowdersville2/12/2022  9:15 AM WMC-MFC NURSE [facility]2/12/2022  9:30 AM [facility] US3 [facility]/15/2024 11:15 AM PiAletha HalimMD [facility]  02/24/2023  3:50 PM PiAletha HalimMD [facility]  03/10/2023  8:40 AM TaDelsa GranaPA-C [facility]  03/10/2023  3:50 PM PrDonnamae JudeMD [facility]  03/17/2023  3:50 PM WeDarlina RumpfCNM [facility]  03/24/2023  3:50 PM PiAletha HalimMD [facility]    TaDonnamae JudeMD

## 2023-01-27 ENCOUNTER — Ambulatory Visit: Payer: BC Managed Care – PPO | Admitting: *Deleted

## 2023-01-27 ENCOUNTER — Other Ambulatory Visit: Payer: Self-pay | Admitting: *Deleted

## 2023-01-27 ENCOUNTER — Ambulatory Visit: Payer: BC Managed Care – PPO | Attending: Maternal & Fetal Medicine

## 2023-01-27 VITALS — BP 113/62 | HR 100

## 2023-01-27 DIAGNOSIS — O34219 Maternal care for unspecified type scar from previous cesarean delivery: Secondary | ICD-10-CM

## 2023-01-27 DIAGNOSIS — O09523 Supervision of elderly multigravida, third trimester: Secondary | ICD-10-CM

## 2023-01-27 DIAGNOSIS — O409XX0 Polyhydramnios, unspecified trimester, not applicable or unspecified: Secondary | ICD-10-CM

## 2023-01-27 DIAGNOSIS — O09522 Supervision of elderly multigravida, second trimester: Secondary | ICD-10-CM | POA: Diagnosis not present

## 2023-01-27 DIAGNOSIS — O403XX0 Polyhydramnios, third trimester, not applicable or unspecified: Secondary | ICD-10-CM

## 2023-01-27 DIAGNOSIS — O0990 Supervision of high risk pregnancy, unspecified, unspecified trimester: Secondary | ICD-10-CM | POA: Diagnosis not present

## 2023-01-27 DIAGNOSIS — Z3A28 28 weeks gestation of pregnancy: Secondary | ICD-10-CM

## 2023-01-27 LAB — CBC
Hematocrit: 32.6 % — ABNORMAL LOW (ref 34.0–46.6)
Hemoglobin: 10.7 g/dL — ABNORMAL LOW (ref 11.1–15.9)
MCH: 29.2 pg (ref 26.6–33.0)
MCHC: 32.8 g/dL (ref 31.5–35.7)
MCV: 89 fL (ref 79–97)
Platelets: 329 10*3/uL (ref 150–450)
RBC: 3.66 x10E6/uL — ABNORMAL LOW (ref 3.77–5.28)
RDW: 12.5 % (ref 11.7–15.4)
WBC: 10.9 10*3/uL — ABNORMAL HIGH (ref 3.4–10.8)

## 2023-01-27 LAB — GLUCOSE TOLERANCE, 2 HOURS W/ 1HR
Glucose, 1 hour: 121 mg/dL (ref 70–179)
Glucose, 2 hour: 88 mg/dL (ref 70–152)
Glucose, Fasting: 73 mg/dL (ref 70–91)

## 2023-01-27 LAB — RPR: RPR Ser Ql: NONREACTIVE

## 2023-01-27 LAB — HIV ANTIBODY (ROUTINE TESTING W REFLEX): HIV Screen 4th Generation wRfx: NONREACTIVE

## 2023-02-10 ENCOUNTER — Encounter: Payer: BC Managed Care – PPO | Admitting: Obstetrics and Gynecology

## 2023-02-10 ENCOUNTER — Encounter: Payer: Self-pay | Admitting: Obstetrics and Gynecology

## 2023-02-10 ENCOUNTER — Ambulatory Visit (INDEPENDENT_AMBULATORY_CARE_PROVIDER_SITE_OTHER): Payer: BC Managed Care – PPO | Admitting: Obstetrics and Gynecology

## 2023-02-10 VITALS — BP 114/77 | HR 98 | Wt 222.0 lb

## 2023-02-10 DIAGNOSIS — O34219 Maternal care for unspecified type scar from previous cesarean delivery: Secondary | ICD-10-CM

## 2023-02-10 DIAGNOSIS — O0990 Supervision of high risk pregnancy, unspecified, unspecified trimester: Secondary | ICD-10-CM

## 2023-02-10 DIAGNOSIS — O403XX0 Polyhydramnios, third trimester, not applicable or unspecified: Secondary | ICD-10-CM

## 2023-02-10 DIAGNOSIS — O0993 Supervision of high risk pregnancy, unspecified, third trimester: Secondary | ICD-10-CM

## 2023-02-10 DIAGNOSIS — Z3A30 30 weeks gestation of pregnancy: Secondary | ICD-10-CM

## 2023-02-10 DIAGNOSIS — O09523 Supervision of elderly multigravida, third trimester: Secondary | ICD-10-CM

## 2023-02-10 NOTE — Progress Notes (Signed)
ROB   U/S on 01/28/20 pt concerned wants to discuss today.   CC: Hemorrhoids.

## 2023-02-10 NOTE — Progress Notes (Signed)
   PRENATAL VISIT NOTE  Subjective:  Beth Cox is a 37 y.o. G3P1011 at 66w3dbeing seen today for ongoing prenatal care.  She is currently monitored for the following issues for this high-risk pregnancy and has Migraine without aura and without status migrainosus, not intractable; Allergic rhinitis with postnasal drip; Fibromyalgia syndrome; Chronic pelvic pain in female; Positive ANA (antinuclear antibody); Left breast mass; Multinodular goiter; Supervision of high risk pregnancy, antepartum; Previous cesarean delivery affecting pregnancy, antepartum; AMA (advanced maternal age) multigravida 35+; and Polyhydramnios affecting pregnancy in third trimester on their problem list.  Patient reports  routine pregnancy aches and backs .  Contractions: Not present. Vag. Bleeding: None.  Movement: Present. Denies leaking of fluid.   The following portions of the patient's history were reviewed and updated as appropriate: allergies, current medications, past family history, past medical history, past social history, past surgical history and problem list.   Objective:   Vitals:   02/10/23 1116  BP: 114/77  Pulse: 98  Weight: 222 lb (100.7 kg)    Fetal Status: Fetal Heart Rate (bpm): 138 Fundal Height: 36 cm Movement: Present     General:  Alert, oriented and cooperative. Patient is in no acute distress.  Skin: Skin is warm and dry. No rash noted.   Cardiovascular: Normal heart rate noted  Respiratory: Normal respiratory effort, no problems with respiration noted  Abdomen: Soft, gravid, appropriate for gestational age.  Pain/Pressure: Present     Pelvic: Cervical exam deferred        Extremities: Normal range of motion.  Edema: Trace  Mental Status: Normal mood and affect. Normal behavior. Normal judgment and thought content.   Assessment and Plan:  Pregnancy: G3P1011 at 359w3d. Supervision of high risk pregnancy, antepartum D/w her re: birth control, including BTL; pt has coSports coach  2. Polyhydramnios affecting pregnancy in third trimester 2/1: 80%, 1417g, ac 8660%afi 33, cephalic S/p normal 2h GTT D/w her re: PTL, breathing precautions  3. Previous cesarean delivery affecting pregnancy, antepartum Desires repeat. Schedule next visit based on most recent u/s  4. Multigravida of advanced maternal age in third trimester  Preterm labor symptoms and general obstetric precautions including but not limited to vaginal bleeding, contractions, leaking of fluid and fetal movement were reviewed in detail with the patient. Please refer to After Visit Summary for other counseling recommendations.   No follow-ups on file.  Future Appointments  Date Time Provider De[place]2/29/2024  9:15 AM W[facility] NURSE [facility]2/29/2024  9:30 AM [facility] US2 [facility]2/29/2024  3:50 PM PiAletha HalimMD [facility]  03/10/2023  8:40 AM TaDelsa GranaPA-C C[facility] CityEC  03/10/2023  3:50 PM PrDonnamae JudeMD [facility]  03/17/2023  3:50 PM WeDarlina RumpfCNM [facility]  03/24/2023  9:15 AM [facility]MFC NURSE [facility]3/28/2024  9:30 AM WMC-MFC US3 [facility]MFCUS [facility]3/28/2024  3:50 PM PiAletha HalimMD [facility]    ChAletha HalimMD

## 2023-02-15 ENCOUNTER — Encounter: Payer: Self-pay | Admitting: *Deleted

## 2023-02-24 ENCOUNTER — Ambulatory Visit: Payer: BC Managed Care – PPO | Admitting: *Deleted

## 2023-02-24 ENCOUNTER — Encounter: Payer: BC Managed Care – PPO | Admitting: Obstetrics and Gynecology

## 2023-02-24 ENCOUNTER — Ambulatory Visit (INDEPENDENT_AMBULATORY_CARE_PROVIDER_SITE_OTHER): Payer: BC Managed Care – PPO | Admitting: Obstetrics and Gynecology

## 2023-02-24 ENCOUNTER — Ambulatory Visit: Payer: BC Managed Care – PPO | Attending: Maternal & Fetal Medicine

## 2023-02-24 VITALS — Wt 224.0 lb

## 2023-02-24 VITALS — BP 113/64 | HR 87

## 2023-02-24 DIAGNOSIS — O409XX0 Polyhydramnios, unspecified trimester, not applicable or unspecified: Secondary | ICD-10-CM | POA: Insufficient documentation

## 2023-02-24 DIAGNOSIS — O34219 Maternal care for unspecified type scar from previous cesarean delivery: Secondary | ICD-10-CM

## 2023-02-24 DIAGNOSIS — O403XX0 Polyhydramnios, third trimester, not applicable or unspecified: Secondary | ICD-10-CM | POA: Diagnosis not present

## 2023-02-24 DIAGNOSIS — O09523 Supervision of elderly multigravida, third trimester: Secondary | ICD-10-CM

## 2023-02-24 DIAGNOSIS — Z3A32 32 weeks gestation of pregnancy: Secondary | ICD-10-CM

## 2023-02-24 DIAGNOSIS — O0990 Supervision of high risk pregnancy, unspecified, unspecified trimester: Secondary | ICD-10-CM

## 2023-02-24 NOTE — Progress Notes (Signed)
   PRENATAL VISIT NOTE  Subjective:  Beth Cox is a 37 y.o. G3P1011 at 66w3dbeing seen today for ongoing prenatal care.  She is currently monitored for the following issues for this high-risk pregnancy and has Migraine without aura and without status migrainosus, not intractable; Allergic rhinitis with postnasal drip; Fibromyalgia syndrome; Chronic pelvic pain in female; Positive ANA (antinuclear antibody); Left breast mass; Multinodular goiter; Supervision of high risk pregnancy, antepartum; Previous cesarean delivery affecting pregnancy, antepartum; AMA (advanced maternal age) multigravida 35+; and Polyhydramnios affecting pregnancy in third trimester on their problem list.  Patient reports no complaints.  Contractions: Not present. Vag. Bleeding: None.  Movement: Present. Denies leaking of fluid.   The following portions of the patient's history were reviewed and updated as appropriate: allergies, current medications, past family history, past medical history, past social history, past surgical history and problem list.   Objective:   Vitals:   02/24/23 1608  Weight: 224 lb (101.6 kg)    Fetal Status: Fetal Heart Rate (bpm): 140   Movement: Present     General:  Alert, oriented and cooperative. Patient is in no acute distress.  Skin: Skin is warm and dry. No rash noted.   Cardiovascular: Normal heart rate noted  Respiratory: Normal respiratory effort, no problems with respiration noted  Abdomen: Soft, gravid, appropriate for gestational age.  Pain/Pressure: Absent     Pelvic: Cervical exam deferred        Extremities: Normal range of motion.     Mental Status: Normal mood and affect. Normal behavior. Normal judgment and thought content.   Assessment and Plan:  Pregnancy: G3P1011 at 346w3d. [redacted] weeks gestation of pregnancy  2. Polyhydramnios affecting pregnancy in third trimester Fluid better today [id number], efw [id number] 83%.  S/p normal GTT and mfm anatomy  u/s, low risk panorama  3. Multigravida of advanced maternal age in third trimester No issues  4. Previous cesarean delivery affecting pregnancy, antepartum Desires repeat. Will schedule for 39wks for now but may change based on subsequent ultrasounds.   Preterm labor symptoms and general obstetric precautions including but not limited to vaginal bleeding, contractions, leaking of fluid and fetal movement were reviewed in detail with the patient. Please refer to After Visit Summary for other counseling recommendations.   Return in about 2 weeks (around 03/10/2023) for in person, high risk ob, md visit.  Future Appointments  Date Time Provider [place]3/14/2024  8:40 AM TaDelsa GranaPA-C [facility]  03/10/2023  3:50 PM PrDonnamae JudeMD [facility]  03/17/2023  3:50 PM WeDarlina RumpfCNM [facility]  03/24/2023  9:15 AM [facility] NURSE [facility]/28[facility]20[facility]  9:30 AM [facility]3 [facility]3/28/2024  3:50 PM PiAletha HalimMD [facility]  03/31/2023  3:50 PM WeDarlina RumpfCNM [facility]  04/07/2023  3:50 PM PiAletha HalimMD [facility]  04/14/2023 11:15 AM WeDarlina RumpfCNM [facility]    ChAletha HalimMD

## 2023-03-01 ENCOUNTER — Encounter: Payer: Self-pay | Admitting: Obstetrics and Gynecology

## 2023-03-10 ENCOUNTER — Ambulatory Visit (INDEPENDENT_AMBULATORY_CARE_PROVIDER_SITE_OTHER): Payer: BC Managed Care – PPO | Admitting: Family Medicine

## 2023-03-10 ENCOUNTER — Ambulatory Visit: Payer: 59 | Admitting: Family Medicine

## 2023-03-10 VITALS — BP 116/77 | HR 101 | Wt 222.0 lb

## 2023-03-10 DIAGNOSIS — O09523 Supervision of elderly multigravida, third trimester: Secondary | ICD-10-CM

## 2023-03-10 DIAGNOSIS — Z3A34 34 weeks gestation of pregnancy: Secondary | ICD-10-CM

## 2023-03-10 DIAGNOSIS — O34219 Maternal care for unspecified type scar from previous cesarean delivery: Secondary | ICD-10-CM

## 2023-03-10 DIAGNOSIS — O0990 Supervision of high risk pregnancy, unspecified, unspecified trimester: Secondary | ICD-10-CM

## 2023-03-10 DIAGNOSIS — O403XX0 Polyhydramnios, third trimester, not applicable or unspecified: Secondary | ICD-10-CM

## 2023-03-10 NOTE — Progress Notes (Signed)
   PRENATAL VISIT NOTE  Subjective:  Beth Cox is a 37 y.o. G3P1011 at [redacted]w[redacted]d being seen today for ongoing prenatal care.  She is currently monitored for the following issues for this high-risk pregnancy and has Migraine without aura and without status migrainosus, not intractable; Allergic rhinitis with postnasal drip; Fibromyalgia syndrome; Chronic pelvic pain in female; Positive ANA (antinuclear antibody); Left breast mass; Multinodular goiter; Supervision of high risk pregnancy, antepartum; Previous cesarean delivery affecting pregnancy, antepartum; AMA (advanced maternal age) multigravida 35+; and Polyhydramnios affecting pregnancy in third trimester on their problem list.  Patient reports no complaints.  Contractions: Irregular. Vag. Bleeding: None.  Movement: Present. Denies leaking of fluid.   The following portions of the patient's history were reviewed and updated as appropriate: allergies, current medications, past family history, past medical history, past social history, past surgical history and problem list.   Objective:   Vitals:   03/10/23 1558  BP: 116/77  Pulse: (!) 101  Weight: 222 lb (100.7 kg)    Fetal Status:   Fundal Height: 33 cm Movement: Present     General:  Alert, oriented and cooperative. Patient is in no acute distress.  Skin: Skin is warm and dry. No rash noted.   Cardiovascular: Normal heart rate noted  Respiratory: Normal respiratory effort, no problems with respiration noted  Abdomen: Soft, gravid, appropriate for gestational age.  Pain/Pressure: Absent     Pelvic: Cervical exam deferred        Extremities: Normal range of motion.  Edema: None  Mental Status: Normal mood and affect. Normal behavior. Normal judgment and thought content.   Assessment and Plan:  Pregnancy: G3P1011 at [redacted]w[redacted]d 1. Supervision of high risk pregnancy, antepartum Continue routine prenatal care.  2. Previous cesarean delivery affecting pregnancy, antepartum For RCS -  scheduled  3. Polyhydramnios affecting pregnancy in third trimester Mild, idiopathic  4. Multigravida of advanced maternal age in third trimester Low risk NIPT  Preterm labor symptoms and general obstetric precautions including but not limited to vaginal bleeding, contractions, leaking of fluid and fetal movement were reviewed in detail with the patient. Please refer to After Visit Summary for other counseling recommendations.   Return in 2 weeks (on 03/24/2023).  Future Appointments  Date Time Provider [place]  03/17/2023  3:50 PM Kieth Brightly [facility]  03/24/2023  9:15 AM [facility] NURSE [facility]  03/24/2023  9:30 AM [facility] US3 [facility]  03/24/2023  3:50 PM Aletha Halim, MD [facility]  03/31/2023  3:50 PM Darlina Rumpf, CNM [facility]  04/07/2023  3:50 PM Aletha Halim, MD [facility]  04/14/2023 11:15 AM Darlina Rumpf, CNM [facility]    Donnamae Jude, MD

## 2023-03-10 NOTE — Progress Notes (Signed)
CC: routine ob  Braxton hicks mainly in the morning

## 2023-03-17 ENCOUNTER — Ambulatory Visit (INDEPENDENT_AMBULATORY_CARE_PROVIDER_SITE_OTHER): Payer: BC Managed Care – PPO | Admitting: Advanced Practice Midwife

## 2023-03-17 VITALS — BP 118/79 | HR 116 | Wt 224.0 lb

## 2023-03-17 DIAGNOSIS — O09523 Supervision of elderly multigravida, third trimester: Secondary | ICD-10-CM

## 2023-03-17 DIAGNOSIS — M5430 Sciatica, unspecified side: Secondary | ICD-10-CM

## 2023-03-17 DIAGNOSIS — O34219 Maternal care for unspecified type scar from previous cesarean delivery: Secondary | ICD-10-CM

## 2023-03-17 DIAGNOSIS — Z3A35 35 weeks gestation of pregnancy: Secondary | ICD-10-CM

## 2023-03-17 DIAGNOSIS — O0990 Supervision of high risk pregnancy, unspecified, unspecified trimester: Secondary | ICD-10-CM

## 2023-03-19 NOTE — Progress Notes (Signed)
   PRENATAL VISIT NOTE  Subjective:  Beth Cox is a 37 y.o. G3P1011 at [redacted]w[redacted]d being seen today for ongoing prenatal care.  She is currently monitored for the following issues for this high-risk pregnancy and has Migraine without aura and without status migrainosus, not intractable; Allergic rhinitis with postnasal drip; Fibromyalgia syndrome; Chronic pelvic pain in female; Positive ANA (antinuclear antibody); Left breast mass; Multinodular goiter; Supervision of high risk pregnancy, antepartum; Previous cesarean delivery affecting pregnancy, antepartum; AMA (advanced maternal age) multigravida 35+; and Polyhydramnios affecting pregnancy in third trimester on their problem list.  Patient reports  ongoing sciatic nerve pain which is affecting her sleep and her gait . Her partner has a former coworker who is a physical therapist and going to help them with stretches to mitigate her pain.  Contractions: Not present. Vag. Bleeding: None.  Movement: Present. Denies leaking of fluid.   The following portions of the patient's history were reviewed and updated as appropriate: allergies, current medications, past family history, past medical history, past social history, past surgical history and problem list. Problem list updated.  Objective:   Vitals:   03/17/23 1559  BP: 118/79  Pulse: (!) 116  Weight: 224 lb (101.6 kg)    Fetal Status: Fetal Heart Rate (bpm): 135   Movement: Present     General:  Alert, oriented and cooperative. Patient is in no acute distress.  Skin: Skin is warm and dry. No rash noted.   Cardiovascular: Normal heart rate noted  Respiratory: Normal respiratory effort, no problems with respiration noted  Abdomen: Soft, gravid, appropriate for gestational age.  Pain/Pressure: Present     Pelvic: Cervical exam deferred        Extremities: Normal range of motion.     Mental Status: Normal mood and affect. Normal behavior. Normal judgment and thought content.   Assessment  and Plan:  Pregnancy: G3P1011 at [redacted]w[redacted]d  1. Supervision of high risk pregnancy, antepartum - Doing so well in her 35th week!  2. Sciatic nerve pain, unspecified laterality - Consider floating in pool to relieve pressure on pelvis  3. Multigravida of advanced maternal age in third trimester   4. Previous cesarean delivery affecting pregnancy, antepartum - For repeat 04/11/2023  5. [redacted] weeks gestation of pregnancy   Preterm labor symptoms and general obstetric precautions including but not limited to vaginal bleeding, contractions, leaking of fluid and fetal movement were reviewed in detail with the patient. Please refer to After Visit Summary for other counseling recommendations.  No follow-ups on file.  Future Appointments  Date Time Provider [place]  03/24/2023  9:15 AM [facility] NURSE [facility]  03/24/2023  9:30 AM [facility] US3 [facility]  03/24/2023  3:50 PM Aletha Halim, MD [facility]  03/31/2023  3:50 PM Darlina Rumpf, CNM [facility]  04/07/2023  3:50 PM Aletha Halim, MD [facility]    Darlina Rumpf, CNM

## 2023-03-24 ENCOUNTER — Ambulatory Visit: Payer: BC Managed Care – PPO | Admitting: *Deleted

## 2023-03-24 ENCOUNTER — Ambulatory Visit (INDEPENDENT_AMBULATORY_CARE_PROVIDER_SITE_OTHER): Payer: BC Managed Care – PPO | Admitting: Obstetrics and Gynecology

## 2023-03-24 ENCOUNTER — Other Ambulatory Visit (HOSPITAL_COMMUNITY)
Admission: RE | Admit: 2023-03-24 | Discharge: 2023-03-24 | Disposition: A | Discharge status: Home / Self Care | Payer: BC Managed Care – PPO | Source: Ambulatory Visit | Attending: Obstetrics and Gynecology | Admitting: Obstetrics and Gynecology

## 2023-03-24 ENCOUNTER — Ambulatory Visit: Payer: BC Managed Care – PPO

## 2023-03-24 VITALS — BP 121/79 | HR 91 | Wt 226.0 lb

## 2023-03-24 VITALS — BP 111/55 | HR 73

## 2023-03-24 DIAGNOSIS — O0993 Supervision of high risk pregnancy, unspecified, third trimester: Secondary | ICD-10-CM

## 2023-03-24 DIAGNOSIS — O409XX0 Polyhydramnios, unspecified trimester, not applicable or unspecified: Secondary | ICD-10-CM | POA: Insufficient documentation

## 2023-03-24 DIAGNOSIS — Z3A36 36 weeks gestation of pregnancy: Secondary | ICD-10-CM

## 2023-03-24 DIAGNOSIS — O09523 Supervision of elderly multigravida, third trimester: Secondary | ICD-10-CM

## 2023-03-24 DIAGNOSIS — O34219 Maternal care for unspecified type scar from previous cesarean delivery: Secondary | ICD-10-CM

## 2023-03-24 DIAGNOSIS — O0990 Supervision of high risk pregnancy, unspecified, unspecified trimester: Secondary | ICD-10-CM

## 2023-03-24 NOTE — Progress Notes (Signed)
    PRENATAL VISIT NOTE  Subjective:  Beth Cox is a 37 y.o. G3P1011 at [redacted]w[redacted]d being seen today for ongoing prenatal care.  She is currently monitored for the following issues for this high-risk pregnancy and has Migraine without aura and without status migrainosus, not intractable; Allergic rhinitis with postnasal drip; Fibromyalgia syndrome; Chronic pelvic pain in female; Positive ANA (antinuclear antibody); Left breast mass; Multinodular goiter; Supervision of high risk pregnancy, antepartum; Previous cesarean delivery affecting pregnancy, antepartum; AMA (advanced maternal age) multigravida 35+; and Polyhydramnios affecting pregnancy in third trimester on their problem list.  Patient reports no complaints.  Contractions: Irritability. Vag. Bleeding: None.  Movement: Present. Denies leaking of fluid.   The following portions of the patient's history were reviewed and updated as appropriate: allergies, current medications, past family history, past medical history, past social history, past surgical history and problem list.   Objective:   Vitals:   03/24/23 1609  BP: 121/79  Pulse: 91  Weight: 226 lb (102.5 kg)    Fetal Status: Fetal Heart Rate (bpm): 152   Movement: Present     General:  Alert, oriented and cooperative. Patient is in no acute distress.  Skin: Skin is warm and dry. No rash noted.   Cardiovascular: Normal heart rate noted  Respiratory: Normal respiratory effort, no problems with respiration noted  Abdomen: Soft, gravid, appropriate for gestational age.  Pain/Pressure: Absent     Pelvic: Cervical exam deferred        Extremities: Normal range of motion.  Edema: None  Mental Status: Normal mood and affect. Normal behavior. Normal judgment and thought content.   Assessment and Plan:  Pregnancy: G3P1011 at [redacted]w[redacted]d 1. Supervision of high risk pregnancy, antepartum Strongly considering a BTL. Procedure, r/b/a d/w her. She has private insurance - Strep Gp B NAA -  Cervicovaginal ancillary only( [place])  2. Polyhydramnios affecting pregnancy in third trimester Resolved today: 55%, 2955gm, ac [id number], afi 18, cephalic, incidental bpp 8/8  3. Multigravida of advanced maternal age in third trimester No issues   4. Previous cesarean delivery affecting pregnancy, antepartum Scheduled for 4/15 repeat  Preterm labor symptoms and general obstetric precautions including but not limited to vaginal bleeding, contractions, leaking of fluid and fetal movement were reviewed in detail with the patient. Please refer to After Visit Summary for other counseling recommendations.   Return in about 1 week (around 03/31/2023) for low risk ob, md or app, in person or virtual.  Future Appointments  Date Time Provider [place]  03/31/2023  3:50 PM Kieth Brightly [facility]  04/07/2023  3:50 PM Aletha Halim, MD [facility]    Aletha Halim, MD

## 2023-03-26 LAB — STREP GP B NAA: Strep Gp B NAA: NEGATIVE

## 2023-03-28 LAB — CERVICOVAGINAL ANCILLARY ONLY
Chlamydia: NEGATIVE
Comment: NEGATIVE
Comment: NORMAL
Neisseria Gonorrhea: NEGATIVE

## 2023-03-28 NOTE — Patient Instructions (Signed)
Beth Cox  03/28/2023   Your procedure is scheduled on:  04/11/2023  Arrive at 12 at Entrance C on [address] at [facility]  and [employer]. You are invited to use the FREE valet parking or use the Visitor's parking deck.  Pick up the phone at the desk and dial [phone].  Call this number if you have problems the morning of surgery: [phone]  Remember:   Do not eat food:(After Midnight) Desps de medianoche.  Do not drink clear liquids: (After Midnight) Desps de medianoche.  Take these medicines the morning of surgery with A SIP OF WATER:  none   Do not wear jewelry, make-up or nail polish.  Do not wear lotions, powders, or perfumes. Do not wear deodorant.  Do not shave 48 hours prior to surgery.  Do not bring valuables to the hospital.  [facility] is not   responsible for any belongings or valuables brought to the hospital.  Contacts, dentures or bridgework may not be worn into surgery.  Leave suitcase in the car. After surgery it may be brought to your room.  For patients admitted to the hospital, checkout time is 11:00 AM the day of              discharge.      Please read over the following fact sheets that you were given:     Preparing for Surgery

## 2023-03-29 ENCOUNTER — Telehealth (HOSPITAL_COMMUNITY): Payer: Self-pay | Admitting: *Deleted

## 2023-03-29 ENCOUNTER — Encounter (HOSPITAL_COMMUNITY): Payer: Self-pay

## 2023-03-29 NOTE — Telephone Encounter (Signed)
Preadmission screen  

## 2023-03-30 ENCOUNTER — Encounter (HOSPITAL_COMMUNITY): Payer: Self-pay

## 2023-03-31 ENCOUNTER — Ambulatory Visit (INDEPENDENT_AMBULATORY_CARE_PROVIDER_SITE_OTHER): Payer: BC Managed Care – PPO | Admitting: Advanced Practice Midwife

## 2023-03-31 VITALS — BP 113/78 | HR 87 | Wt 227.0 lb

## 2023-03-31 DIAGNOSIS — O0990 Supervision of high risk pregnancy, unspecified, unspecified trimester: Secondary | ICD-10-CM

## 2023-03-31 DIAGNOSIS — O34219 Maternal care for unspecified type scar from previous cesarean delivery: Secondary | ICD-10-CM

## 2023-03-31 DIAGNOSIS — Z3A37 37 weeks gestation of pregnancy: Secondary | ICD-10-CM

## 2023-03-31 DIAGNOSIS — O09523 Supervision of elderly multigravida, third trimester: Secondary | ICD-10-CM

## 2023-04-01 NOTE — Progress Notes (Signed)
   PRENATAL VISIT NOTE  Subjective:  Beth Cox is a 37 y.o. G3P1011 at [redacted]w[redacted]d being seen today for ongoing prenatal care.  She is currently monitored for the following issues for this high-risk pregnancy and has Migraine without aura and without status migrainosus, not intractable; Allergic rhinitis with postnasal drip; Fibromyalgia syndrome; Chronic pelvic pain in female; Positive ANA (antinuclear antibody); Left breast mass; Multinodular goiter; Supervision of high risk pregnancy, antepartum; Previous cesarean delivery affecting pregnancy, antepartum; AMA (advanced maternal age) multigravida 35+; and Polyhydramnios affecting pregnancy in third trimester on their problem list.  Patient reports discomfort at her LLQ which she associates with fetal lie. Contractions: Irritability. Vag. Bleeding: None.  Movement: Present. Denies leaking of fluid.   The following portions of the patient's history were reviewed and updated as appropriate: allergies, current medications, past family history, past medical history, past social history, past surgical history and problem list. Problem list updated.  Objective:   Vitals:   03/31/23 1602  BP: 113/78  Pulse: 87  Weight: 227 lb (103 kg)    Fetal Status: Fetal Heart Rate (bpm): 135   Movement: Present     General:  Alert, oriented and cooperative. Patient is in no acute distress.  Skin: Skin is warm and dry. No rash noted.   Cardiovascular: Normal heart rate noted  Respiratory: Normal respiratory effort, no problems with respiration noted  Abdomen: Soft, gravid, appropriate for gestational age.  Pain/Pressure: Absent     Pelvic: Cervical exam deferred        Extremities: Normal range of motion.     Mental Status: Normal mood and affect. Normal behavior. Normal judgment and thought content.   Assessment and Plan:  Pregnancy: G3P1011 at [redacted]w[redacted]d  1. Supervision of high risk pregnancy, antepartum - Doing well, no acute concerns  2.  Multigravida of advanced maternal age in third trimester - 51  3. Previous cesarean delivery affecting pregnancy, antepartum - For repeat 04/15  4. [redacted] weeks gestation of pregnancy   Term labor symptoms and general obstetric precautions including but not limited to vaginal bleeding, contractions, leaking of fluid and fetal movement were reviewed in detail with the patient. Please refer to After Visit Summary for other counseling recommendations.  Return in about 1 week (around 04/07/2023)   Future Appointments  Date Time Provider Department Center  04/07/2023  3:50 PM [place] Bing, MD [facility]  04/08/2023 10:00 AM [facility] PAT 1 [facility] None    Calvert Cantor, CNM

## 2023-04-07 ENCOUNTER — Telehealth: Payer: BC Managed Care – PPO | Admitting: Obstetrics and Gynecology

## 2023-04-07 VITALS — BP 125/73

## 2023-04-07 DIAGNOSIS — Z3A38 38 weeks gestation of pregnancy: Secondary | ICD-10-CM

## 2023-04-07 DIAGNOSIS — O34219 Maternal care for unspecified type scar from previous cesarean delivery: Secondary | ICD-10-CM

## 2023-04-07 DIAGNOSIS — O0993 Supervision of high risk pregnancy, unspecified, third trimester: Secondary | ICD-10-CM

## 2023-04-07 DIAGNOSIS — O0990 Supervision of high risk pregnancy, unspecified, unspecified trimester: Secondary | ICD-10-CM

## 2023-04-07 NOTE — Progress Notes (Signed)
     TELEHEALTH OBSTETRICS VISIT ENCOUNTER NOTE  Provider location: Center for [facility]   Patient location: Home  I connected with Dwaine Gale on 04/07/23 at  3:50 PM EDT by telephone at home and verified that I am speaking with the correct person using two identifiers. Of note, unable to do video encounter due to technical difficulties.    I discussed the limitations, risks, security and privacy concerns of performing an evaluation and management service by telephone and the availability of in person appointments. I also discussed with the patient that there may be a patient responsible charge related to this service. The patient expressed understanding and agreed to proceed.  Subjective:  Beth Cox is a 37 y.o. G3P1011 at [redacted]w[redacted]d being followed for ongoing prenatal care.  She is currently monitored for the following issues for this low-risk pregnancy and has Migraine without aura and without status migrainosus, not intractable; Allergic rhinitis with postnasal drip; Fibromyalgia syndrome; Chronic pelvic pain in female; Positive ANA (antinuclear antibody); Left breast mass; Multinodular goiter; Supervision of high risk pregnancy, antepartum; Previous cesarean delivery affecting pregnancy, antepartum; AMA (advanced maternal age) multigravida 35+; and Polyhydramnios affecting pregnancy in third trimester on their problem list.  Patient reports no complaints. Reports fetal movement. Denies any contractions, bleeding or leaking of fluid.   The following portions of the patient's history were reviewed and updated as appropriate: allergies, current medications, past family history, past medical history, past social history, past surgical history and problem list.   Objective:  Blood pressure 125/73, last menstrual period 07/16/2022, unknown if currently breastfeeding. General:  Alert, oriented and cooperative.   Mental Status: Normal mood and affect perceived.  Normal judgment and thought content.  Rest of physical exam deferred due to type of encounter  Assessment and Plan:  Pregnancy: G3P1011 at [redacted]w[redacted]d 1. Supervision of high risk pregnancy, antepartum May want BTL; pt has private insurance GBS neg   2. Resolved Polyhydramnios   3. Multigravida of advanced maternal age in third trimester No issues   4. Previous cesarean delivery affecting pregnancy, antepartum Scheduled for 4/15 repeat  Term labor symptoms and general obstetric precautions including but not limited to vaginal bleeding, contractions, leaking of fluid and fetal movement were reviewed in detail with the patient.  I discussed the assessment and treatment plan with the patient. The patient was provided an opportunity to ask questions and all were answered. The patient agreed with the plan and demonstrated an understanding of the instructions. The patient was advised to call back or seek an in-person office evaluation/go to [facility] at [facility] for any urgent or concerning symptoms. Please refer to After Visit Summary for other counseling recommendations.   I provided 7 minutes of non-face-to-face time during this encounter.  Return in about 12 days (around 04/19/2023) for rn visit. Incision check   Future Appointments  Date Time Provider Department Center  04/08/2023 10:00 AM [facility] PAT 1 [facility] None    [place] Bing, MD Center for [employer], [facility]

## 2023-04-08 ENCOUNTER — Encounter (HOSPITAL_COMMUNITY)
Admission: RE | Admit: 2023-04-08 | Discharge: 2023-04-08 | Disposition: A | Discharge status: Home / Self Care | Payer: BC Managed Care – PPO | Source: Ambulatory Visit | Attending: Family Medicine | Admitting: Family Medicine

## 2023-04-08 DIAGNOSIS — F419 Anxiety disorder, unspecified: Secondary | ICD-10-CM | POA: Diagnosis not present

## 2023-04-08 DIAGNOSIS — Z01812 Encounter for preprocedural laboratory examination: Secondary | ICD-10-CM | POA: Insufficient documentation

## 2023-04-08 DIAGNOSIS — Z23 Encounter for immunization: Secondary | ICD-10-CM | POA: Diagnosis not present

## 2023-04-08 DIAGNOSIS — O34219 Maternal care for unspecified type scar from previous cesarean delivery: Secondary | ICD-10-CM

## 2023-04-08 DIAGNOSIS — Z3A39 39 weeks gestation of pregnancy: Secondary | ICD-10-CM | POA: Diagnosis not present

## 2023-04-08 DIAGNOSIS — O3413 Maternal care for benign tumor of corpus uteri, third trimester: Secondary | ICD-10-CM | POA: Diagnosis not present

## 2023-04-08 DIAGNOSIS — O09523 Supervision of elderly multigravida, third trimester: Secondary | ICD-10-CM | POA: Diagnosis not present

## 2023-04-08 DIAGNOSIS — D252 Subserosal leiomyoma of uterus: Secondary | ICD-10-CM | POA: Diagnosis not present

## 2023-04-08 DIAGNOSIS — O99344 Other mental disorders complicating childbirth: Secondary | ICD-10-CM | POA: Diagnosis not present

## 2023-04-08 DIAGNOSIS — Z2989 Encounter for other specified prophylactic measures: Secondary | ICD-10-CM | POA: Diagnosis not present

## 2023-04-08 DIAGNOSIS — O34211 Maternal care for low transverse scar from previous cesarean delivery: Secondary | ICD-10-CM | POA: Diagnosis not present

## 2023-04-08 DIAGNOSIS — Z302 Encounter for sterilization: Secondary | ICD-10-CM | POA: Diagnosis not present

## 2023-04-08 LAB — COMPREHENSIVE METABOLIC PANEL
ALT: 12 U/L (ref 0–44)
AST: 18 U/L (ref 15–41)
Albumin: 2.7 g/dL — ABNORMAL LOW (ref 3.5–5.0)
Alkaline Phosphatase: 241 U/L — ABNORMAL HIGH (ref 38–126)
Anion gap: 8 (ref 5–15)
BUN: <5 mg/dL — ABNORMAL LOW (ref 6–20)
CO2: 23 mmol/L (ref 22–32)
Calcium: 9 mg/dL (ref 8.9–10.3)
Chloride: 105 mmol/L (ref 98–111)
Creatinine, Ser: 0.6 mg/dL (ref 0.44–1.00)
GFR, Estimated: >60 mL/min (ref >60)
Glucose, Bld: 94 mg/dL (ref 70–99)
Potassium: 3.7 mmol/L (ref 3.5–5.1)
Sodium: 136 mmol/L (ref 135–145)
Total Bilirubin: 0.7 mg/dL (ref 0.3–1.2)
Total Protein: 6.7 g/dL (ref 6.5–8.1)

## 2023-04-08 LAB — TYPE AND SCREEN
ABO/RH(D): A POS
Antibody Screen: NEGATIVE

## 2023-04-08 LAB — CBC
HCT: 31.6 % — ABNORMAL LOW (ref 36.0–46.0)
Hemoglobin: 10 g/dL — ABNORMAL LOW (ref 12.0–15.0)
MCH: 24.9 pg — ABNORMAL LOW (ref 26.0–34.0)
MCHC: 31.6 g/dL (ref 30.0–36.0)
MCV: 78.8 fL — ABNORMAL LOW (ref 80.0–100.0)
Platelets: 307 10*3/uL (ref 150–400)
RBC: 4.01 MIL/uL (ref 3.87–5.11)
RDW: 15 % (ref 11.5–15.5)
WBC: 11.7 10*3/uL — ABNORMAL HIGH (ref 4.0–10.5)
nRBC: 0 % (ref 0.0–0.2)

## 2023-04-08 LAB — RPR: RPR Ser Ql: NONREACTIVE

## 2023-04-11 ENCOUNTER — Encounter (HOSPITAL_COMMUNITY): Payer: Self-pay | Admitting: Family Medicine

## 2023-04-11 ENCOUNTER — Inpatient Hospital Stay (HOSPITAL_COMMUNITY): Payer: BC Managed Care – PPO | Admitting: Anesthesiology

## 2023-04-11 ENCOUNTER — Encounter (HOSPITAL_COMMUNITY)
Admission: RE | Disposition: A | Discharge status: Home / Self Care | Payer: Self-pay | Source: Home / Self Care | Attending: Family Medicine

## 2023-04-11 ENCOUNTER — Other Ambulatory Visit: Payer: Self-pay

## 2023-04-11 ENCOUNTER — Inpatient Hospital Stay (HOSPITAL_COMMUNITY)
Admission: RE | Admit: 2023-04-11 | Discharge: 2023-04-13 | DRG: 785 | Disposition: A | Discharge status: Home / Self Care | Payer: BC Managed Care – PPO | Attending: Family Medicine | Admitting: Family Medicine

## 2023-04-11 DIAGNOSIS — O34211 Maternal care for low transverse scar from previous cesarean delivery: Secondary | ICD-10-CM | POA: Diagnosis present

## 2023-04-11 DIAGNOSIS — O0990 Supervision of high risk pregnancy, unspecified, unspecified trimester: Secondary | ICD-10-CM

## 2023-04-11 DIAGNOSIS — O34219 Maternal care for unspecified type scar from previous cesarean delivery: Principal | ICD-10-CM | POA: Diagnosis present

## 2023-04-11 DIAGNOSIS — Z3009 Encounter for other general counseling and advice on contraception: Secondary | ICD-10-CM | POA: Insufficient documentation

## 2023-04-11 DIAGNOSIS — O09529 Supervision of elderly multigravida, unspecified trimester: Secondary | ICD-10-CM

## 2023-04-11 DIAGNOSIS — D252 Subserosal leiomyoma of uterus: Secondary | ICD-10-CM | POA: Diagnosis present

## 2023-04-11 DIAGNOSIS — Z3A39 39 weeks gestation of pregnancy: Secondary | ICD-10-CM | POA: Diagnosis not present

## 2023-04-11 DIAGNOSIS — O3413 Maternal care for benign tumor of corpus uteri, third trimester: Secondary | ICD-10-CM | POA: Diagnosis present

## 2023-04-11 DIAGNOSIS — Z98891 History of uterine scar from previous surgery: Secondary | ICD-10-CM

## 2023-04-11 DIAGNOSIS — O09523 Supervision of elderly multigravida, third trimester: Secondary | ICD-10-CM | POA: Diagnosis not present

## 2023-04-11 DIAGNOSIS — Z302 Encounter for sterilization: Secondary | ICD-10-CM

## 2023-04-11 SURGERY — Surgical Case
Anesthesia: Spinal

## 2023-04-11 MED ORDER — SIMETHICONE 80 MG PO CHEW
80.0000 mg | CHEWABLE_TABLET | Freq: Three times a day (TID) | ORAL | Status: DC
  Administered 2023-04-11 – 2023-04-13 (×6): 80 mg via ORAL
  Filled 2023-04-11 (×6): qty 1

## 2023-04-11 MED ORDER — ZOLPIDEM TARTRATE 5 MG PO TABS: 5.0000 mg | ORAL_TABLET | Freq: Every evening | ORAL | Status: DC | PRN

## 2023-04-11 MED ORDER — GABAPENTIN 100 MG PO CAPS
200.0000 mg | ORAL_CAPSULE | Freq: Every day | ORAL | Status: DC
  Administered 2023-04-11 – 2023-04-12 (×2): 200 mg via ORAL
  Filled 2023-04-11 (×3): qty 2

## 2023-04-11 MED ORDER — PRENATAL MULTIVITAMIN CH
1.0000 | ORAL_TABLET | Freq: Every day | ORAL | Status: DC
  Administered 2023-04-12 – 2023-04-13 (×2): 1 via ORAL
  Filled 2023-04-11 (×2): qty 1

## 2023-04-11 MED ORDER — OXYTOCIN-SODIUM CHLORIDE 30-0.9 UT/500ML-% IV SOLN
INTRAVENOUS | Status: DC | PRN
  Administered 2023-04-11: 300 mL via INTRAVENOUS

## 2023-04-11 MED ORDER — DIBUCAINE (PERIANAL) 1 % EX OINT: 1.0000 | TOPICAL_OINTMENT | CUTANEOUS | Status: DC | PRN

## 2023-04-11 MED ORDER — METOCLOPRAMIDE HCL 5 MG/ML IJ SOLN
INTRAMUSCULAR | Status: AC
  Filled 2023-04-11: qty 2

## 2023-04-11 MED ORDER — ONDANSETRON HCL 4 MG/2ML IJ SOLN: 4.0000 mg | Freq: Three times a day (TID) | INTRAMUSCULAR | Status: DC | PRN

## 2023-04-11 MED ORDER — DIPHENHYDRAMINE HCL 25 MG PO CAPS: 25.0000 mg | ORAL_CAPSULE | Freq: Four times a day (QID) | ORAL | Status: DC | PRN

## 2023-04-11 MED ORDER — FENTANYL CITRATE (PF) 100 MCG/2ML IJ SOLN
INTRAMUSCULAR | Status: AC
  Filled 2023-04-11: qty 2

## 2023-04-11 MED ORDER — OXYTOCIN-SODIUM CHLORIDE 30-0.9 UT/500ML-% IV SOLN
INTRAVENOUS | Status: AC
  Filled 2023-04-11: qty 500

## 2023-04-11 MED ORDER — ENOXAPARIN SODIUM 60 MG/0.6ML IJ SOSY
50.0000 mg | PREFILLED_SYRINGE | INTRAMUSCULAR | Status: DC
  Administered 2023-04-12: 50 mg via SUBCUTANEOUS
  Filled 2023-04-11: qty 0.6

## 2023-04-11 MED ORDER — METHYLERGONOVINE MALEATE 0.2 MG/ML IJ SOLN
INTRAMUSCULAR | Status: DC | PRN
  Administered 2023-04-11: .2 mg via INTRAMUSCULAR

## 2023-04-11 MED ORDER — DEXAMETHASONE SODIUM PHOSPHATE 4 MG/ML IJ SOLN
INTRAMUSCULAR | Status: AC
  Filled 2023-04-11: qty 1

## 2023-04-11 MED ORDER — MORPHINE SULFATE (PF) 0.5 MG/ML IJ SOLN
INTRAMUSCULAR | Status: DC | PRN
  Administered 2023-04-11: .15 mg via INTRATHECAL

## 2023-04-11 MED ORDER — FENTANYL CITRATE (PF) 100 MCG/2ML IJ SOLN
25.0000 ug | INTRAMUSCULAR | Status: DC | PRN
  Administered 2023-04-11: 25 ug via INTRAVENOUS
  Filled 2023-04-11: qty 2

## 2023-04-11 MED ORDER — FENTANYL CITRATE (PF) 100 MCG/2ML IJ SOLN
INTRAMUSCULAR | Status: DC | PRN
  Administered 2023-04-11: 15 ug via INTRATHECAL

## 2023-04-11 MED ORDER — NALOXONE HCL 4 MG/10ML IJ SOLN: 1.0000 ug/kg/h | INTRAVENOUS | Status: DC | PRN

## 2023-04-11 MED ORDER — SODIUM CHLORIDE 0.9 % IR SOLN
Status: DC | PRN
  Administered 2023-04-11: 1

## 2023-04-11 MED ORDER — KETOROLAC TROMETHAMINE 30 MG/ML IJ SOLN
30.0000 mg | Freq: Four times a day (QID) | INTRAMUSCULAR | Status: AC
  Administered 2023-04-11 – 2023-04-12 (×4): 30 mg via INTRAVENOUS
  Filled 2023-04-11 (×3): qty 1

## 2023-04-11 MED ORDER — MENTHOL 3 MG MT LOZG: 1.0000 | LOZENGE | OROMUCOSAL | Status: DC | PRN

## 2023-04-11 MED ORDER — COCONUT OIL OIL: 1.0000 | TOPICAL_OIL | Status: DC | PRN

## 2023-04-11 MED ORDER — ACETAMINOPHEN 10 MG/ML IV SOLN
INTRAVENOUS | Status: DC | PRN
  Administered 2023-04-11: 1000 mg via INTRAVENOUS

## 2023-04-11 MED ORDER — TRANEXAMIC ACID-NACL 1000-0.7 MG/100ML-% IV SOLN
INTRAVENOUS | Status: DC | PRN
  Administered 2023-04-11: 1000 mg via INTRAVENOUS

## 2023-04-11 MED ORDER — OXYTOCIN-SODIUM CHLORIDE 30-0.9 UT/500ML-% IV SOLN
2.5000 [IU]/h | INTRAVENOUS | Status: AC
  Administered 2023-04-11: 2.5 [IU]/h via INTRAVENOUS
  Filled 2023-04-11: qty 500

## 2023-04-11 MED ORDER — CEFAZOLIN SODIUM-DEXTROSE 2-4 GM/100ML-% IV SOLN
2.0000 g | INTRAVENOUS | Status: AC
  Administered 2023-04-11: 2 g via INTRAVENOUS

## 2023-04-11 MED ORDER — SOD CITRATE-CITRIC ACID 500-334 MG/5ML PO SOLN
ORAL | Status: AC
  Filled 2023-04-11: qty 30

## 2023-04-11 MED ORDER — SODIUM CHLORIDE 0.9% FLUSH: 3.0000 mL | INTRAVENOUS | Status: DC | PRN

## 2023-04-11 MED ORDER — SENNOSIDES-DOCUSATE SODIUM 8.6-50 MG PO TABS
2.0000 | ORAL_TABLET | Freq: Every day | ORAL | Status: DC
  Administered 2023-04-12 – 2023-04-13 (×2): 2 via ORAL
  Filled 2023-04-11 (×2): qty 2

## 2023-04-11 MED ORDER — METHYLERGONOVINE MALEATE 0.2 MG/ML IJ SOLN
INTRAMUSCULAR | Status: AC
  Filled 2023-04-11: qty 1

## 2023-04-11 MED ORDER — DEXAMETHASONE SODIUM PHOSPHATE 4 MG/ML IJ SOLN
INTRAMUSCULAR | Status: DC | PRN
  Administered 2023-04-11: 4 mg via INTRAVENOUS

## 2023-04-11 MED ORDER — DIPHENHYDRAMINE HCL 25 MG PO CAPS: 25.0000 mg | ORAL_CAPSULE | ORAL | Status: DC | PRN

## 2023-04-11 MED ORDER — ONDANSETRON HCL 4 MG/2ML IJ SOLN
INTRAMUSCULAR | Status: DC | PRN
  Administered 2023-04-11: 4 mg via INTRAVENOUS

## 2023-04-11 MED ORDER — SCOPOLAMINE 1 MG/3DAYS TD PT72
MEDICATED_PATCH | TRANSDERMAL | Status: DC | PRN
  Administered 2023-04-11: 1 via TRANSDERMAL

## 2023-04-11 MED ORDER — NALOXONE HCL 0.4 MG/ML IJ SOLN: 0.4000 mg | INTRAMUSCULAR | Status: DC | PRN

## 2023-04-11 MED ORDER — ACETAMINOPHEN 325 MG PO TABS: 325.0000 mg | ORAL_TABLET | ORAL | Status: DC | PRN

## 2023-04-11 MED ORDER — LACTATED RINGERS IV SOLN: INTRAVENOUS | Status: DC

## 2023-04-11 MED ORDER — KETOROLAC TROMETHAMINE 30 MG/ML IJ SOLN: 30.0000 mg | Freq: Four times a day (QID) | INTRAMUSCULAR | Status: DC | PRN

## 2023-04-11 MED ORDER — ACETAMINOPHEN 500 MG PO TABS
1000.0000 mg | ORAL_TABLET | Freq: Four times a day (QID) | ORAL | Status: DC
  Administered 2023-04-11 – 2023-04-13 (×7): 1000 mg via ORAL
  Filled 2023-04-11 (×7): qty 2

## 2023-04-11 MED ORDER — WITCH HAZEL-GLYCERIN EX PADS: 1.0000 | MEDICATED_PAD | CUTANEOUS | Status: DC | PRN

## 2023-04-11 MED ORDER — ACETAMINOPHEN 10 MG/ML IV SOLN: 1000.0000 mg | Freq: Once | INTRAVENOUS | Status: DC | PRN

## 2023-04-11 MED ORDER — IBUPROFEN 600 MG PO TABS
600.0000 mg | ORAL_TABLET | Freq: Four times a day (QID) | ORAL | Status: DC
  Administered 2023-04-12 – 2023-04-13 (×3): 600 mg via ORAL
  Filled 2023-04-11 (×3): qty 1

## 2023-04-11 MED ORDER — CEFAZOLIN SODIUM-DEXTROSE 2-4 GM/100ML-% IV SOLN
INTRAVENOUS | Status: AC
  Filled 2023-04-11: qty 100

## 2023-04-11 MED ORDER — SCOPOLAMINE 1 MG/3DAYS TD PT72: 1.0000 | MEDICATED_PATCH | Freq: Once | TRANSDERMAL | Status: DC

## 2023-04-11 MED ORDER — PROMETHAZINE HCL 25 MG/ML IJ SOLN: 6.2500 mg | INTRAMUSCULAR | Status: DC | PRN

## 2023-04-11 MED ORDER — METOCLOPRAMIDE HCL 5 MG/ML IJ SOLN
INTRAMUSCULAR | Status: DC | PRN
  Administered 2023-04-11: 10 mg via INTRAVENOUS

## 2023-04-11 MED ORDER — OXYCODONE HCL 5 MG PO TABS
5.0000 mg | ORAL_TABLET | ORAL | Status: DC | PRN
  Administered 2023-04-13: 5 mg via ORAL
  Filled 2023-04-11: qty 1

## 2023-04-11 MED ORDER — SOD CITRATE-CITRIC ACID 500-334 MG/5ML PO SOLN
30.0000 mL | ORAL | Status: AC
  Administered 2023-04-11: 30 mL via ORAL

## 2023-04-11 MED ORDER — ONDANSETRON HCL 4 MG/2ML IJ SOLN
INTRAMUSCULAR | Status: AC
  Filled 2023-04-11: qty 2

## 2023-04-11 MED ORDER — MORPHINE SULFATE (PF) 0.5 MG/ML IJ SOLN
INTRAMUSCULAR | Status: AC
  Filled 2023-04-11: qty 10

## 2023-04-11 MED ORDER — TRANEXAMIC ACID-NACL 1000-0.7 MG/100ML-% IV SOLN
INTRAVENOUS | Status: AC
  Filled 2023-04-11: qty 100

## 2023-04-11 MED ORDER — SIMETHICONE 80 MG PO CHEW: 80.0000 mg | CHEWABLE_TABLET | ORAL | Status: DC | PRN

## 2023-04-11 MED ORDER — TETANUS-DIPHTH-ACELL PERTUSSIS 5-2.5-18.5 LF-MCG/0.5 IM SUSY: 0.5000 mL | PREFILLED_SYRINGE | Freq: Once | INTRAMUSCULAR | Status: DC

## 2023-04-11 MED ORDER — ACETAMINOPHEN 160 MG/5ML PO SOLN: 325.0000 mg | ORAL | Status: DC | PRN

## 2023-04-11 MED ORDER — DIPHENHYDRAMINE HCL 50 MG/ML IJ SOLN: 12.5000 mg | INTRAMUSCULAR | Status: DC | PRN

## 2023-04-11 MED ORDER — PHENYLEPHRINE HCL-NACL 20-0.9 MG/250ML-% IV SOLN
INTRAVENOUS | Status: DC | PRN
  Administered 2023-04-11: 60 ug/min via INTRAVENOUS

## 2023-04-11 MED ORDER — MEPERIDINE HCL 25 MG/ML IJ SOLN: 6.2500 mg | INTRAMUSCULAR | Status: DC | PRN

## 2023-04-11 MED ORDER — KETOROLAC TROMETHAMINE 30 MG/ML IJ SOLN
INTRAMUSCULAR | Status: AC
  Filled 2023-04-11: qty 1

## 2023-04-11 MED ORDER — STERILE WATER FOR IRRIGATION IR SOLN
Status: DC | PRN
  Administered 2023-04-11: 1000 mL

## 2023-04-11 MED ORDER — POVIDONE-IODINE 10 % EX SWAB
2.0000 | Freq: Once | CUTANEOUS | Status: AC
  Administered 2023-04-11: 2 via TOPICAL

## 2023-04-11 MED ORDER — BUPIVACAINE IN DEXTROSE 0.75-8.25 % IT SOLN
INTRATHECAL | Status: DC | PRN
  Administered 2023-04-11: 1.6 mL via INTRATHECAL

## 2023-04-11 SURGICAL SUPPLY — 37 items
APL PRP STRL LF DISP 70% ISPRP (MISCELLANEOUS) ×2
CHLORAPREP W/TINT 26 (MISCELLANEOUS) ×2 IMPLANT
CLAMP UMBILICAL CORD (MISCELLANEOUS) ×1 IMPLANT
CLOTH BEACON ORANGE TIMEOUT ST (SAFETY) ×1 IMPLANT
DRSG OPSITE POSTOP 4X10 (GAUZE/BANDAGES/DRESSINGS) ×1 IMPLANT
ELECT REM PT RETURN 9FT ADLT (ELECTROSURGICAL) ×1
ELECTRODE REM PT RTRN 9FT ADLT (ELECTROSURGICAL) ×1 IMPLANT
EXTRACTOR VACUUM BELL STYLE (SUCTIONS) IMPLANT
GAUZE PAD ABD 7.5X8 STRL (GAUZE/BANDAGES/DRESSINGS) IMPLANT
GAUZE SPONGE 4X4 12PLY STRL LF (GAUZE/BANDAGES/DRESSINGS) IMPLANT
GLOVE BIOGEL PI IND STRL 7.0 (GLOVE) ×2 IMPLANT
GLOVE ECLIPSE 7.0 STRL STRAW (GLOVE) ×1 IMPLANT
GOWN STRL REUS W/TWL LRG LVL3 (GOWN DISPOSABLE) ×2 IMPLANT
HEMOSTAT ARISTA ABSORB 3G PWDR (HEMOSTASIS) IMPLANT
KIT ABG SYR 3ML LUER SLIP (SYRINGE) ×1 IMPLANT
LIGASURE IMPACT 36 18CM CVD LR (INSTRUMENTS) IMPLANT
MAT PREVALON FULL STRYKER (MISCELLANEOUS) IMPLANT
NDL HYPO 25X5/8 SAFETYGLIDE (NEEDLE) ×1 IMPLANT
NEEDLE HYPO 25X5/8 SAFETYGLIDE (NEEDLE) ×1 IMPLANT
NS IRRIG 1000ML POUR BTL (IV SOLUTION) ×1 IMPLANT
PACK C SECTION WH (CUSTOM PROCEDURE TRAY) ×1 IMPLANT
PAD OB MATERNITY 4.3X12.25 (PERSONAL CARE ITEMS) ×1 IMPLANT
RTRCTR C-SECT PINK 25CM LRG (MISCELLANEOUS) ×1 IMPLANT
SUT MNCRL 0 VIOLET CTX 36 (SUTURE) ×2 IMPLANT
SUT MONOCRYL 0 CTX 36 (SUTURE) ×2
SUT PLAIN 0 NONE (SUTURE) IMPLANT
SUT PLAIN 2 0 (SUTURE)
SUT PLAIN 2 0 XLH (SUTURE) IMPLANT
SUT PLAIN ABS 2-0 CT1 27XMFL (SUTURE) IMPLANT
SUT VIC AB 0 CTX 36 (SUTURE) ×1
SUT VIC AB 0 CTX36XBRD ANBCTRL (SUTURE) ×1 IMPLANT
SUT VIC AB 2-0 CT1 27 (SUTURE) ×1
SUT VIC AB 2-0 CT1 TAPERPNT 27 (SUTURE) IMPLANT
SUT VIC AB 4-0 KS 27 (SUTURE) ×1 IMPLANT
TOWEL OR 17X24 6PK STRL BLUE (TOWEL DISPOSABLE) ×1 IMPLANT
TRAY FOLEY W/BAG SLVR 14FR LF (SET/KITS/TRAYS/PACK) IMPLANT
WATER STERILE IRR 1000ML POUR (IV SOLUTION) ×1 IMPLANT

## 2023-04-11 NOTE — Anesthesia Preprocedure Evaluation (Addendum)
Anesthesia Evaluation  Patient identified by MRN, date of birth, ID band Patient awake    Reviewed: Allergy & Precautions, NPO status , Patient's Chart, lab work & pertinent test results  Airway Mallampati: III       Dental no notable dental hx.    Pulmonary    Pulmonary exam normal        Cardiovascular negative cardio ROS Normal cardiovascular exam     Neuro/Psych  Headaches  Anxiety        GI/Hepatic negative GI ROS, Neg liver ROS,,,  Endo/Other    Renal/GU      Musculoskeletal   Abdominal   Peds  Hematology   Anesthesia Other Findings   Reproductive/Obstetrics (+) Pregnancy                             Anesthesia Physical Anesthesia Plan  ASA: 2  Anesthesia Plan: Spinal   Post-op Pain Management: Minimal or no pain anticipated   Induction:   PONV Risk Score and Plan: 0  Airway Management Planned: Natural Airway  Additional Equipment: None  Intra-op Plan:   Post-operative Plan:   Informed Consent: I have reviewed the patients History and Physical, chart, labs and discussed the procedure including the risks, benefits and alternatives for the proposed anesthesia with the patient or authorized representative who has indicated his/her understanding and acceptance.       Plan Discussed with:   Anesthesia Plan Comments: (Lab Results      Component                Value               Date                      WBC                      11.7 (H)            04/08/2023                HGB                      10.0 (L)            04/08/2023                HCT                      31.6 (L)            04/08/2023                MCV                      78.8 (L)            04/08/2023                PLT                      307                 04/08/2023           )       Anesthesia Quick Evaluation

## 2023-04-11 NOTE — Anesthesia Postprocedure Evaluation (Signed)
Anesthesia Post Note  Patient: Beth Cox  Procedure(s) Performed: CESAREAN SECTION     Patient location during evaluation: PACU Anesthesia Type: Spinal Level of consciousness: oriented and awake and alert Pain management: pain level controlled Vital Signs Assessment: post-procedure vital signs reviewed and stable Respiratory status: spontaneous breathing, respiratory function stable and patient connected to nasal cannula oxygen Cardiovascular status: blood pressure returned to baseline and stable Postop Assessment: no headache, no backache and no apparent nausea or vomiting Anesthetic complications: no  No notable events documented.  Last Vitals:  Vitals:   04/11/23 1445 04/11/23 1514  BP: 105/61 114/74  Pulse: 61 73  Resp: 14 17  Temp: (!) 36.3 C (!) 36.3 C  SpO2: 99% 100%    Last Pain:  Vitals:   04/11/23 1514  TempSrc: Oral  PainSc: 8    Pain Goal:    LLE Motor Response: Purposeful movement (04/11/23 1445) LLE Sensation: Tingling (04/11/23 1445) RLE Motor Response: Purposeful movement (04/11/23 1445) RLE Sensation: Tingling (04/11/23 1445)     Epidural/Spinal Function Cutaneous sensation: Vague (04/11/23 1510), Patient able to flex knees: Yes (04/11/23 1510), Patient able to lift hips off bed: No (04/11/23 1510), Back pain beyond tenderness at insertion site: No (04/11/23 1510), Progressively worsening motor and/or sensory loss: No (04/11/23 1510), Bowel and/or bladder incontinence post epidural: No (04/11/23 1510)  Shelton Silvas

## 2023-04-11 NOTE — Discharge Summary (Signed)
Postpartum Discharge Summary  Date of Service updated***     Patient Name: Beth Cox DOB: Feb 06, 1986 MRN: [id number]  Date of admission: 04/11/2023 Delivery date:04/11/2023  Delivering provider: Venora Maples  Date of discharge: 04/11/2023  Admitting diagnosis: RCS Intrauterine pregnancy: [redacted]w[redacted]d     Secondary diagnosis:  Principal Problem:   Status post repeat low transverse cesarean section Active Problems:   Supervision of high risk pregnancy, antepartum   Previous cesarean delivery affecting pregnancy, antepartum   AMA (advanced maternal age) multigravida 35+   Unwanted fertility   Status post tubal ligation at time of delivery, current hospitalization  Additional problems: ***    Discharge diagnosis: {DX.:23714}                                              Post partum procedures:{Postpartum procedures:23558} Augmentation: N/A Complications: None  Hospital course: Sceduled C/S   37 y.o. yo [id number] at [redacted]w[redacted]d was admitted to the hospital 04/11/2023 for scheduled elective repeat cesarean section with bilateral tubal ligation with the following indication:Elective Repeat and undesired fertility .Delivery details are as follows:  Membrane Rupture Time/Date: 12:38 PM ,04/11/2023   Delivery Method:C-Section, Low Transverse  Details of operation can be found in separate operative note.  Patient had a postpartum course complicated by***.  She is ambulating, tolerating a regular diet, passing flatus, and urinating well. Patient is discharged home in stable condition on  04/11/23        Newborn Data: Birth date:04/11/2023  Birth time:12:39 PM  Gender:Female  Living status:Living  Apgars:8 ,9  Weight:3200 g     Magnesium Sulfate received: No BMZ received: No Rhophylac:N/A MMR:N/A T-DaP:{Tdap:23962} Flu: {OVF:64332} Transfusion:{Transfusion received:30440034}  Physical exam  Vitals:   04/11/23 1328 04/11/23 1330 04/11/23 1331 04/11/23 1347  BP: (!) 137/103 124/66   108/75  Pulse:    68  Resp: 17   19  Temp: 97.6 F (36.4 C)     TempSrc: Oral     SpO2:   99% 99%  Weight:      Height:       General: {Exam; general:21111117} Lochia: {Desc; appropriate/inappropriate:30686::"appropriate"} Uterine Fundus: {Desc; firm/soft:30687} Incision: {Exam; incision:21111123} DVT Evaluation: {Exam; dvt:2111122} Labs: Lab Results  Component Value Date   WBC 11.7 (H) 04/08/2023   HGB 10.0 (L) 04/08/2023   HCT 31.6 (L) 04/08/2023   MCV 78.8 (L) 04/08/2023   PLT 307 04/08/2023      Latest Ref Rng & Units 04/08/2023    9:47 AM  CMP  Glucose 70 - 99 mg/dL 94   BUN 6 - 20 mg/dL <5   Creatinine 9.51 - 1.00 mg/dL 8.84   Sodium 166 - 063 mmol/L 136   Potassium 3.5 - 5.1 mmol/L 3.7   Chloride 98 - 111 mmol/L 105   CO2 22 - 32 mmol/L 23   Calcium 8.9 - 10.3 mg/dL 9.0   Total Protein 6.5 - 8.1 g/dL 6.7   Total Bilirubin 0.3 - 1.2 mg/dL 0.7   Alkaline Phos 38 - 126 U/L 241   AST 15 - 41 U/L 18   ALT 0 - 44 U/L 12    Edinburgh Score:    07/13/2019   12:42 PM  Edinburgh Postnatal Depression Scale Screening Tool  I have been able to laugh and see the funny side of things. 0  I have looked forward  with enjoyment to things. 0  I have blamed myself unnecessarily when things went wrong. 1  I have been anxious or worried for no good reason. 1  I have felt scared or panicky for no good reason. 0  Things have been getting on top of me. 0  I have been so unhappy that I have had difficulty sleeping. 0  I have felt sad or miserable. 1  I have been so unhappy that I have been crying. 1  The thought of harming myself has occurred to me. 0  Edinburgh Postnatal Depression Scale Total 4     After visit meds:  Allergies as of 04/11/2023       Reactions   Peanuts  [peanut Oil] Shortness Of Breath   Shellfish Allergy Swelling     Med Rec must be completed prior to using this [facility]***        Discharge home in stable condition Infant Feeding: {Baby  feeding:23562} Infant Disposition:{CHL IP OB HOME WITH BMWUXL:24401} Discharge instruction: per After Visit Summary and Postpartum booklet. Activity: Advance as tolerated. Pelvic rest for 6 weeks.  Diet: {OB UUVO:53664403} Future Appointments: Future Appointments  Date Time Provider Department Center  04/21/2023  3:50 PM [facility] NURSE [facility]   Follow up Visit:  The following message was sent to [facility] by Gwenyth Allegra, MD  Please schedule this patient for a In person postpartum visit in 4- 6 weeks with the following provider: Any provider. Additional Postpartum F/U:Incision check 1 week  High risk pregnancy complicated by:  AMA, Delivery mode:  C-Section, Low Transverse  Anticipated Birth Control:  BTL done   04/11/2023 Birgit Nowling Lizabeth Leyden, MD

## 2023-04-11 NOTE — Anesthesia Procedure Notes (Signed)
Spinal  Start time: 04/11/2023 12:09 PM End time: 04/11/2023 12:11 PM Reason for block: surgical anesthesia Staffing Performed: anesthesiologist  Anesthesiologist: Shelton Silvas, MD Performed by: Shelton Silvas, MD Authorized by: Shelton Silvas, MD   Preanesthetic Checklist Completed: patient identified, IV checked, site marked, risks and benefits discussed, surgical consent, monitors and equipment checked, pre-op evaluation and timeout performed Spinal Block Patient position: sitting Prep: DuraPrep and site prepped and draped Location: L3-4 Injection technique: single-shot Needle Needle type: Pencan  Needle gauge: 24 G Needle length: 10 cm Needle insertion depth: 10 cm Additional Notes Patient tolerated well. No immediate complications.  Functioning IV was confirmed and monitors were applied. Sterile prep and drape, including hand hygiene and sterile gloves were used. The patient was positioned and the back was prepped. The skin was anesthetized with lidocaine. Free flow of clear CSF was obtained prior to injecting local anesthetic into the CSF. The spinal needle aspirated freely following injection. The needle was carefully withdrawn. The patient tolerated the procedure well.

## 2023-04-11 NOTE — H&P (Signed)
LABOR AND DELIVERY ADMISSION HISTORY AND PHYSICAL NOTE  Beth Cox is a 37 y.o. female G3P1011 with IUP at 103w0d presenting for scheduled repeat Cesarean section.   Patient reports the fetal movement as active. Patient reports uterine contraction  activity as none. Patient reports  vaginal bleeding as none. Patient describes fluid per vagina as None.   Patient denies headache, vision changes, chest pain, shortness of breath, right upper quadrant pain, or LE edema.  She plans on breast feeding feeding. Her contraception plan is: bilateral tubal ligation.  Prenatal History/Complications: PNC at [facility]  Sono:  , CWD, normal anatomy, cephalic presentation, Posterior placenta, 55%ile, EFW 2955g  Pregnancy complications:  Patient Active Problem List   Diagnosis Date Noted   Polyhydramnios affecting pregnancy in third trimester 02/10/2023   Supervision of high risk pregnancy, antepartum 10/06/2022   Previous cesarean delivery affecting pregnancy, antepartum 10/06/2022   AMA (advanced maternal age) multigravida 35+ 10/06/2022   Multinodular goiter 08/05/2020   Left breast mass 08/02/2016   Positive ANA (antinuclear antibody) 07/22/2016   Fibromyalgia syndrome 09/23/2015   Chronic pelvic pain in female 09/23/2015   Allergic rhinitis with postnasal drip 07/25/2015   Migraine without aura and without status migrainosus, not intractable     Past Medical History: Past Medical History:  Diagnosis Date   Allergy    Anxiety, mild 06/04/2015   Fibromyalgia syndrome 09/23/2015   Migraine    Multinodular goiter 08/05/2020    Past Surgical History: Past Surgical History:  Procedure Laterality Date   CESAREAN SECTION N/A 07/13/2019   Procedure: CESAREAN SECTION;  Surgeon: Ward, Elenora Fender, MD;  Location: [facility] ORS;  Service: Obstetrics;  Laterality: N/A;    Obstetrical History: OB History     Gravida  3   Para  1   Term  1   Preterm      AB  1   Living  1      SAB       IAB      Ectopic      Multiple  0   Live Births  1           Social History: Social History   Socioeconomic History   Marital status: Married    Spouse name: Jess Barters   Number of children: Not on file   Years of education: 18   Highest education level: Not on file  Occupational History   Occupation: Actuary    Comment: [employer]  Tobacco Use   Smoking status: Never   Smokeless tobacco: Never  Vaping Use   Vaping Use: Never used  Substance and Sexual Activity   Alcohol use: No    Alcohol/week: 0.0 standard drinks of alcohol   Drug use: No   Sexual activity: Yes    Partners: Male  Other Topics Concern   Not on file  Social History Narrative   Not on file   Social Determinants of Health   Financial Resource Strain: Low Risk  (09/02/2022)   Overall Financial Resource Strain (CARDIA)    Difficulty of Paying Living Expenses: Not hard at all  Food Insecurity: No Food Insecurity (09/02/2022)   Hunger Vital Sign    Worried About Running Out of Food in the Last Year: Never true    Ran Out of Food in the Last Year: Never true  Transportation Needs: No Transportation Needs (09/02/2022)   PRAPARE - Administrator, Civil Service (Medical): No    Lack of Transportation (Non-Medical):  No  Physical Activity: Sufficiently Active (09/02/2022)   Exercise Vital Sign    Days of Exercise per Week: 5 days    Minutes of Exercise per Session: 30 min  Stress: No Stress Concern Present (09/02/2022)   [employer] of Occupational Health - Occupational Stress Questionnaire    Feeling of Stress : Only a little  Social Connections: Socially Integrated (09/02/2022)   Social Connection and Isolation Panel [NHANES]    Frequency of Communication with Friends and Family: More than three times a week    Frequency of Social Gatherings with Friends and Family: More than three times a week    Attends Religious Services: 1 to 4 times per year    Active Member of [employer]  or Organizations: Yes    Attends Banker Meetings: 1 to 4 times per year    Marital Status: Married    Family History: Family History  Problem Relation Age of Onset   Hypertension Mother    Depression Mother    Depression Father    Cancer Paternal Uncle        lung CA   Stroke Paternal Grandfather    Heart disease Paternal Grandfather    Cancer Paternal Grandfather        lung and possibly colon cancer   Breast cancer Neg Hx    Asthma Neg Hx    Diabetes Neg Hx     Allergies: Allergies  Allergen Reactions   Peanuts  [Peanut Oil] Shortness Of Breath   Shellfish Allergy Swelling    Medications Prior to Admission  Medication Sig Dispense Refill Last Dose   omeprazole (PRILOSEC OTC) 20 MG tablet Take 20 mg by mouth every other day.   Past Week   pantoprazole (PROTONIX) 40 MG tablet Take 1 tablet (40 mg total) by mouth daily. (Patient not taking: Reported on 01/26/2023) 30 tablet 3 Not Taking   Prenatal Vit-Fe Fumarate-FA (PRENATAL MULTIVITAMIN) TABS tablet Take 1 tablet by mouth daily at 12 noon. Prenatal choline   04/10/2023   Prenatal Vit-Fe Fumarate-FA (PRENATAL VITAMINS PO) Take 1 tablet by mouth daily. Advance      Doxylamine-Pyridoxine (DICLEGIS) 10-10 MG TBEC Take 1-2 tablets by mouth at bedtime as needed (nausea/vomiting). (Patient not taking: Reported on 11/23/2022) 120 tablet 2 Not Taking   promethazine (PHENERGAN) 25 MG tablet Take 1 tablet (25 mg total) by mouth every 6 (six) hours as needed for nausea or vomiting. (Patient not taking: Reported on 11/23/2022) 30 tablet 2 Not Taking     Review of Systems  All systems reviewed and negative except as stated in HPI  Physical Exam BP 118/83   Pulse 97   Temp 98.4 F (36.9 C) (Oral)   Resp 18   Ht  (1.702 m)   Wt 102.5 kg   LMP 07/16/2022 (Approximate)   SpO2 99%   Breastfeeding Yes   BMI 35.40 kg/m   Physical Exam Constitutional:      General: She is not in acute distress.    Appearance:  Normal appearance. She is not toxic-appearing.  HENT:     Head: Normocephalic and atraumatic.     Nose: Nose normal.     Mouth/Throat:     Mouth: Mucous membranes are moist.  Eyes:     Extraocular Movements: Extraocular movements intact.     Conjunctiva/sclera: Conjunctivae normal.  Cardiovascular:     Rate and Rhythm: Normal rate.     Pulses: Normal pulses.  Pulmonary:     Effort:  Pulmonary effort is normal. No respiratory distress.  Abdominal:     Tenderness: There is no abdominal tenderness.     Comments: Gravid, well healed barely visible low transverse surgical scar noted  Skin:    General: Skin is warm and dry.     Capillary Refill: Capillary refill takes less than 2 seconds.  Neurological:     General: No focal deficit present.     Mental Status: She is alert.  Psychiatric:        Mood and Affect: Mood normal.        Behavior: Behavior normal.    FHR: 144bpm  Prenatal labs: ABO, Rh: --/--/A POS (04/12 4580) Antibody: NEG (04/12 9983) Rubella: 2.81 (10/11 1003) RPR: NON REACTIVE (04/12 0947)  HBsAg: Negative (10/11 1003)  HIV: Non Reactive (01/31 0920)  GC/Chlamydia:  Neisseria Gonorrhea  Date Value Ref Range Status  03/24/2023 Negative  Final   Chlamydia  Date Value Ref Range Status  03/24/2023 Negative  Final   GBS: Negative/-- (03/28 1600)  Prenatal Transfer Tool  Maternal Diabetes: No Genetic Screening: Normal Maternal Ultrasounds/Referrals: Normal, Had polyhydramnios that resolved Fetal Ultrasounds or other Referrals:  None Maternal Substance Abuse:  No Significant Maternal Medications:  None Significant Maternal Lab Results: Group B Strep negative  No results found for this or any previous visit (from the past 24 hour(s)).  Assessment: JUNI FASS is a 37 y.o. G3P1011 at [redacted]w[redacted]d here for scheduled elective cesarean section. Pregnancy is remarkable for AMA.  #Repeat Low Transverse Cesarean and Bilateral Tubal Ligation via Bilateral  salpingectomy if surgical anatomy allows for this.   The risks of cesarean section were discussed with the patient including but were not limited to: bleeding which may require transfusion or reoperation; infection which may require antibiotics; injury to bowel, bladder, ureters or other surrounding organs; injury to the fetus; need for additional procedures including hysterectomy in the event of a life-threatening hemorrhage; placental abnormalities wth subsequent pregnancies, incisional problems, thromboembolic phenomenon and other postoperative/anesthesia complications.  Patient also desires permanent sterilization.  Other reversible forms of contraception were discussed with patient; she declines all other modalities. Risks of procedure discussed with patient including but not limited to: risk of regret, permanence of method, bleeding, infection, injury to surrounding organs and need for additional procedures.  Failure risk of about 1% with increased risk of ectopic gestation if pregnancy occurs was also discussed with patient.  Also discussed possibility of post-tubal pain syndrome. The patient concurred with the proposed plan, giving informed written consent for the procedures.  Patient has been NPO since last night she will remain NPO for procedure. Anesthesia and OR aware.  Preoperative prophylactic antibiotics and SCDs ordered on call to the OR.   #Anesthesia: spinal #FWB: FHR 144 #GBS/ID: Negative #MOF: breast feeding #MOC: bilateral tubal ligation #Circ: Yes  Sheppard Evens MD MPH OB Fellow, Faculty Practice [facility], [facility] 04/11/2023

## 2023-04-11 NOTE — Lactation Note (Signed)
This note was copied from a baby's chart. Lactation Consultation Note  Patient Name: Beth Cox [id number] Date: 04/11/2023 Age:37 hours Reason for consult: Initial assessment;Term Mom holding sleeping baby. Mom stated the baby BF well. Before he fed he was fussy and wouldn't latch. Mom gave him a little formula and he calmed down then she put the baby on the breast and he BF well then. Mom's 1st child was a NICU baby so mom pumped and bottle fed then BF and formula fed then switched to formula after 3 months. Mom doesn't have any questions or concerns at this time. Newborn feeding habits, behavior, STS, I&O reviewed. Mom encouraged to feed baby 8-12 times/24 hours and with feeding cues.   Encouraged mom to call for assistance as needed.  Maternal Data Has patient been taught Hand Expression?: Yes Does the patient have breastfeeding experience prior to this delivery?: Yes How long did the patient breastfeed?: 3 months  Feeding Mother's Current Feeding Choice: Breast Milk and Formula  LATCH Score       Type of Nipple: Everted at rest and after stimulation (short shaft compressible)  Comfort (Breast/Nipple): Soft / non-tender         Lactation Tools Discussed/Used    Interventions Interventions: Breast feeding basics reviewed;LC Services brochure  Discharge    Consult Status Consult Status: Follow-up Date: 05/12/23 Follow-up type: In-patient    Charyl Dancer 04/11/2023, 11:55 PM

## 2023-04-11 NOTE — Transfer of Care (Signed)
Immediate Anesthesia Transfer of Care Note  Patient: Beth Cox  Procedure(s) Performed: CESAREAN SECTION  Patient Location: PACU  Anesthesia Type:Spinal  Level of Consciousness: awake, alert , and oriented  Airway & Oxygen Therapy: Patient Spontanous Breathing  Post-op Assessment: Report given to RN and Post -op Vital signs reviewed and stable  Post vital signs: Reviewed and stable  Last Vitals:  Vitals Value Taken Time  BP 124/66 04/11/23 1330  Temp    Pulse 68 04/11/23 1333  Resp 18 04/11/23 1333  SpO2 100 % 04/11/23 1333  Vitals shown include unvalidated device data.  Last Pain:  Vitals:   04/11/23 1103  TempSrc: Oral         Complications: No notable events documented.

## 2023-04-11 NOTE — Op Note (Addendum)
Operative Note   Patient: Beth Cox  Date of Procedure: 04/11/2023  Procedure: Repeat Low Transverse Cesarean and Bilateral Tubal Ligation via Bilateral salpingectomy   Indications:  Prior Cesarean section , undesired fertility  Pre-operative Diagnosis: previous cesarean section: pt desires sterility.   Post-operative Diagnosis: Same and Bilateral Tubal Sterilization via Bilateral salpingectomy  TOLAC Candidate: N/a   Surgeon: Surgeon(s) and Role:    * Venora Maples, MD - Primary    * Ndulue, Nadene Rubins, MD - Assisting  Assistants: Sheppard Evens MD  An experienced assistant was required given the standard of surgical care given the complexity of the case.  This assistant was needed for exposure, dissection, suctioning, retraction, instrument exchange, assisting with delivery with administration of fundal pressure, and for overall help during the procedure.  Anesthesia: spinal  Anesthesiologist: No responsible provider has been recorded for the case.   Antibiotics: Cefazolin   Estimated Blood Loss: 468 ml   Total IV Fluids: 400 ml  Urine Output:  100 cc OF clear urine  Specimens: placenta to L&D, bilateral fallopian tubes to pathology   Complications: no complications   Indications: Beth Cox is a 37 y.o. [id number] with an IUP [redacted]w[redacted]d presenting for scheduled cesarean secondary to the indications listed above.  The risks of cesarean section were discussed with the patient including but were not limited to: bleeding which may require transfusion or reoperation; infection which may require antibiotics; injury to bowel, bladder, ureters or other surrounding organs; injury to the fetus; need for additional procedures including hysterectomy in the event of a life-threatening hemorrhage; placental abnormalities wth subsequent pregnancies, incisional problems, thromboembolic phenomenon and other postoperative/anesthesia complications.  Patient also desires  permanent sterilization.  Other reversible forms of contraception were discussed with patient; she declines all other modalities. Risks of procedure discussed with patient including but not limited to: risk of regret, permanence of method, bleeding, infection, injury to surrounding organs and need for additional procedures.  Failure risk of about 1% with increased risk of ectopic gestation if pregnancy occurs was also discussed with patient.  Also discussed possibility of post-tubal pain syndrome. The patient concurred with the proposed plan, giving informed written consent for the procedures.  Patient has been NPO since last night she will remain NPO for procedure. Anesthesia and OR aware.  Preoperative prophylactic antibiotics and SCDs ordered on call to the OR.   Findings: Viable infant in cephalic presentation, nuchal x1. Apgars 8 , 9 , . Weight 3200 g .Light meconium stained amniotic fluid. Normal placenta, three vessel cord. Scant adhesive disease. Normal uterus, small subserosal fibroid noted on fundus, Normal left fallopian tube, right fallopian tube adhered to right ovary, Normal bilateral ovaries.  Procedure Details: A Time Out was held and the above information confirmed. The patient received intravenous antibiotics and had sequential compression devices applied to her lower extremities preoperatively. The patient was taken back to the operative suite where spinal anesthesia was administered. After induction of anesthesia, the patient was draped and prepped in the usual sterile manner and placed in a dorsal supine position with a leftward tilt. A low transverse skin incision was made with scalpel and carried down through the subcutaneous tissue to the fascia. Fascial incision was made and extended transversely. The fascia was separated from the underlying rectus tissue superiorly and inferiorly. The rectus muscles were separated in the midline bluntly and the peritoneum was entered bluntly. An Alexis  retractor was placed to aid in visualization of the uterus. A bladder flap  was not developed. A low transverse uterine incision was made. The infant was successfully delivered from cephalic presentation, the umbilical cord was clamped after 1 minute. Cord ph was not sent, and cord blood was obtained for evaluation. The placenta was removed Intact and appeared normal. The uterine incision was closed with a single layer running unlocked suture of 0-Monocryl. Due to ongoing bleeding from the hysterotomy, one figure of eight sutures of 0 Monocryl was placed, and a layer of arista was applied, after which there was excellent hemostasis.  Attention was then turned to the fallopian tubes. The right Fallopian tube was identified and then traced to it's fimbriae. A portion of the distal fallopian tube was noted to be adherent to the ovary, but there was a clear window between the mesosalpinx and the ovary. Using the Ligasure device and taking care to avoid large vascular structures, the right fallopian tube was first removed sequentially from the fimbriae to the cornua, with excellent hemostasis noted. The Ligasure device was then used to separate the adherent potion of the fallopian tube from the ovary with excellent hemostasis again noted. Attention was then turned to the left fallopian tube, and after confirmation of identification by tracing the tube out to the fimbriae, the same procedure was then performed with excellent hemostasis noted.  The abdomen and pelvis were cleared of all clot and debris and the Jon Gills was removed. Hemostasis was confirmed on all surfaces.  The peritoneum was reapproximated using 2-0 vicryl . The fascia was then closed using 0 Vicryl in a running fashion. The subcutaneous layer was not reapproximated. The skin was closed with a 4-0 vicryl subcuticular stitch. The patient tolerated the procedure well. Sponge, lap, instrument and needle counts were correct x 2. She was taken to the recovery  room in stable condition.  Disposition: PACU - hemodynamically stable.    Signed: Sheppard Evens MD MPH OB Fellow, [facility], Center for [facility] 04/11/2023      Attestation of Attending Supervision of Obstetric Fellow During Surgery: An experienced assistant was required given the standard of surgical care given the complexity of the case.  This assistant was needed for exposure, dissection, suctioning, retraction, instrument exchange, assisting with delivery with administration of fundal pressure, and for overall help during the procedure. Surgery was performed with the Obstetric Fellow under my supervision and collaboration.  I was present and scrubbed for the entire procedure.   I have reviewed the Obstetric Fellow's operative report, and I agree with the documentation except as noted below.  I have edited the above note for accuracy and clarity  Venora Maples, MD/MPH Attending Family Medicine Physician, [facility] for [facility], [facility]

## 2023-04-12 LAB — CBC
HCT: 26.4 % — ABNORMAL LOW (ref 36.0–46.0)
Hemoglobin: 8.2 g/dL — ABNORMAL LOW (ref 12.0–15.0)
MCH: 24.6 pg — ABNORMAL LOW (ref 26.0–34.0)
MCHC: 31.1 g/dL (ref 30.0–36.0)
MCV: 79 fL — ABNORMAL LOW (ref 80.0–100.0)
Platelets: 270 10*3/uL (ref 150–400)
RBC: 3.34 MIL/uL — ABNORMAL LOW (ref 3.87–5.11)
RDW: 15.3 % (ref 11.5–15.5)
WBC: 17.5 10*3/uL — ABNORMAL HIGH (ref 4.0–10.5)
nRBC: 0 % (ref 0.0–0.2)

## 2023-04-12 MED ORDER — FERROUS SULFATE 325 (65 FE) MG PO TABS
325.0000 mg | ORAL_TABLET | ORAL | Status: DC
  Administered 2023-04-12: 325 mg via ORAL
  Filled 2023-04-12: qty 1

## 2023-04-12 NOTE — Progress Notes (Signed)
Circumcision Consent  Discussed with mom at bedside about circumcision.   Circumcision is a surgery that removes the skin that covers the tip of the penis, called the "foreskin." Circumcision is usually done when a boy is between 1 and 10 days old, sometimes up to 3-4 weeks old.  The most common reasons boys are circumcised include for cultural/religious beliefs or for parental preference (potentially easier to clean, so baby looks like daddy, etc).  There may be some medical benefits for circumcision:   Circumcised boys seem to have slightly lower rates of: ? Urinary tract infections (per the American Academy of Pediatrics an uncircumcised boy has a 1/100 chance of developing a UTI in the first year of life, a circumcised boy at a 12/998 chance of developing a UTI in the first year of life- a 10% reduction) ? Penis cancer (typically rare- an uncircumcised female has a 1 in 100,000 chance of developing cancer of the penis) ? Sexually transmitted infection (in endemic areas, including HIV, HPV and Herpes- circumcision does NOT protect against gonorrhea, chlamydia, trachomatis, or syphilis) ? Phimosis: a condition where that makes retraction of the foreskin over the glans impossible (0.4 per 1000 boys per year or 0.6% of boys are affected by their 15th birthday)  Boys and men who are not circumcised can reduce these extra risks by: ? Cleaning their penis well ? Using condoms during sex  What are the risks of circumcision?  As with any surgical procedure, there are risks and complications. In circumcision, complications are rare and usually minor, the most common being: ? Bleeding- risk is reduced by holding each clamp for 30 seconds prior to a cut being made, and by holding pressure after the procedure is done ? Infection- the penis is cleaned prior to the procedure, and the procedure is done under sterile technique ? Damage to the urethra or amputation of the penis  How is circumcision done  in baby boys?  The baby will be placed on a special table and the legs restrained for their safety. Numbing medication is injected into the penis, and the skin is cleansed with betadine to decrease the risk of infection.   What to expect:  The penis will look red and raw for 5-7 days as it heals. We expect scabbing around where the cut was made, as well as clear-pink fluid and some swelling of the penis right after the procedure. If your baby's circumcision starts to bleed or develops pus, please contact your pediatrician immediately.  All questions were answered and mother consented.  Bellanie Matthew V Pricella Gaugh, MD 8:45 AM  

## 2023-04-12 NOTE — Progress Notes (Signed)
MOB was referred for history of anxiety.  * Referral screened out by Clinical Social Worker because none of the following criteria appear to apply:  ~ History of anxiety during this pregnancy, or of post-partum depression following prior delivery.  ~ Diagnosis of anxiety within last 3 years  Per OB notes, MOB did not indicate any signs/symptoms during pregnancy.  OR   * MOB's symptoms currently being treated with medication and/or therapy.   Please contact the Clinical Social Worker if needs arise, by MOB request, or if MOB scores greater than 9/yes to question 10 on Edinburgh Postpartum Depression Screen.   Parish Dubose, LCSWA Clinical Social Worker [phone] 

## 2023-04-12 NOTE — Progress Notes (Addendum)
POSTPARTUM PROGRESS NOTE  Post Partum Day #1  Subjective:  Beth Cox is a 37 y.o. [id number] s/p repeat low transverse cesarean and bilateral tubal ligation via bilateral salpingectomy at [redacted]w[redacted]d.  No acute events overnight.  Pt denies problems with voiding or po intake. She reports she had her catheter removed about an hour prior and plans to get up to urinate around 0800. She denies nausea or vomiting.  Pain is moderately controlled. She reports her pain is about a 5/10. She has not had flatus. She has not had bowel movement.  Patient endorses vaginal bleeding.  Objective: Blood pressure 99/61, pulse 64, temperature 98.1 F (36.7 C), temperature source Oral, resp. rate 18, height  (1.702 m), weight 102.5 kg, last menstrual period 07/16/2022, SpO2 100 %, currently breastfeeding.  Physical Exam:  General: alert, cooperative and no distress Chest: no respiratory distress Heart:regular rate, distal pulses intact Abdomen: soft, nontender,  Uterine Fundus: firm, appropriately tender DVT Evaluation: No calf swelling or tenderness Extremities: No edema Skin: warm, dry; incision covered with dressing  Recent Labs    04/12/23 0601  HGB 8.2*  HCT 26.4*   Assessment/Plan: Beth Cox is a 37 y.o. O1H0865 s/p repeat low transverse cesarean and bilateral tubal ligation via bilateral salpingectomy at [redacted]w[redacted]d.  POD#1 - Doing well  Contraception: Tubal ligation Feeding: Breast  Dispo: Plan for discharge POD#2-3   LOS: 1 day   Bubba Hales, Medical Student 04/12/2023, 7:40 AM    Fellow Attestation  I saw and evaluated the patient, performing the key elements of the service.I  personally performed or re-performed the history, physical exam, and medical decision making activities of this service and have verified that the service and findings are accurately documented in the student's note. I developed the management plan that is described in the student's note, and I agree  with the content, with my edits above.    Derrel Nip, MD OB Fellow

## 2023-04-13 ENCOUNTER — Other Ambulatory Visit (HOSPITAL_COMMUNITY): Payer: Self-pay

## 2023-04-13 LAB — BIRTH TISSUE RECOVERY COLLECTION (PLACENTA DONATION)

## 2023-04-13 LAB — SURGICAL PATHOLOGY

## 2023-04-13 MED ORDER — SENNOSIDES-DOCUSATE SODIUM 8.6-50 MG PO TABS
2.0000 | ORAL_TABLET | Freq: Every day | ORAL | 0 refills | Status: DC
  Filled 2023-04-13: qty 60, 30d supply, fill #0

## 2023-04-13 MED ORDER — IBUPROFEN 600 MG PO TABS
600.0000 mg | ORAL_TABLET | Freq: Four times a day (QID) | ORAL | 0 refills | Status: DC
  Filled 2023-04-13: qty 30, 8d supply, fill #0

## 2023-04-13 MED ORDER — OXYCODONE HCL 5 MG PO TABS
5.0000 mg | ORAL_TABLET | ORAL | 0 refills | Status: DC | PRN
  Filled 2023-04-13: qty 20, 4d supply, fill #0

## 2023-04-13 MED ORDER — FERROUS SULFATE 325 (65 FE) MG PO TABS
325.0000 mg | ORAL_TABLET | ORAL | 3 refills | Status: DC
  Filled 2023-04-13: qty 60, 120d supply, fill #0

## 2023-04-13 MED ORDER — ACETAMINOPHEN 500 MG PO TABS
1000.0000 mg | ORAL_TABLET | Freq: Four times a day (QID) | ORAL | 0 refills | Status: DC
  Filled 2023-04-13: qty 30, 4d supply, fill #0

## 2023-04-13 NOTE — Progress Notes (Addendum)
POSTPARTUM PROGRESS NOTE  Post Partum Day #2  Subjective:  Beth Cox is a 37 y.o. [id number] s/p repeat low transverse cesarean and bilateral tubal ligation via bilateral salpingectomy at [redacted]w[redacted]d.  No acute events overnight.  Pt denies problems with ambulating, voiding or po intake.  She denies nausea or vomiting.  Pain is moderately controlled.  She reports her pain increased from yesterday stating it was about a 7/10 this morning before receiving pain medication. She has not had flatus but endorses belching. She has not had bowel movement.  She reports normal vaginal bleeding.  Objective: Blood pressure 104/71, pulse 80, temperature 97.7 F (36.5 C), temperature source Oral, resp. rate 18, height  (1.702 m), weight 102.5 kg, last menstrual period 07/16/2022, SpO2 100 %, currently breastfeeding.  Physical Exam:  General: alert, cooperative and no distress Chest: no respiratory distress Heart:regular rate, distal pulses intact Abdomen: soft, nontender,  Uterine Fundus: firm, appropriately tender DVT Evaluation: No calf swelling or tenderness Extremities: No edema Skin: warm, dry; incision covered with water-proof dressing  Recent Labs    04/12/23 0601  HGB 8.2*  HCT 26.4*   Assessment/Plan: Beth Cox is a 37 y.o. H4V4259 s/p repeat low transverse cesarean and bilateral tubal ligation via bilateral salpingectomy at [redacted]w[redacted]d   PPD#2 - Doing well  Contraception: Tubal Ligation Feeding: Breast Dispo: Plan for discharge today or tomorrow   LOS: 2 days   Bubba Hales, Medical Student 04/13/2023, 7:23 AM    Fellow Attestation  I saw and evaluated the patient, performing the key elements of the service.I  personally performed or re-performed the history, physical exam, and medical decision making activities of this service and have verified that the service and findings are accurately documented in the student's note. I developed the management plan that is  described in the medical student's note, and I agree with the content, with my edits above.    Derrel Nip, MD

## 2023-04-13 NOTE — Lactation Note (Signed)
This note was copied from a baby's chart. Lactation Consultation Note  Patient Name: Beth Cox [id number] Date: 04/13/2023 Age:37 hours Reason for consult: Follow-up assessment  Family being discharged.  Reminded mother to apply ice packs if she gets engorged and stools should be yellow by day 5. Feeding Mother's Current Feeding Choice: Breast Milk and Formula  LInterventions Interventions: Education  Discharge Discharge Education: Warning signs for feeding baby;Engorgement and breast care  Consult Status Consult Status: Complete Date: 04/13/23    Dahlia Byes [facility] 04/13/2023, 1:09 PM

## 2023-04-14 ENCOUNTER — Encounter: Payer: 59 | Admitting: Advanced Practice Midwife

## 2023-04-15 ENCOUNTER — Ambulatory Visit (INDEPENDENT_AMBULATORY_CARE_PROVIDER_SITE_OTHER): Payer: BC Managed Care – PPO | Admitting: *Deleted

## 2023-04-15 VITALS — BP 128/84 | HR 80

## 2023-04-15 DIAGNOSIS — Z98891 History of uterine scar from previous surgery: Secondary | ICD-10-CM

## 2023-04-15 NOTE — Progress Notes (Signed)
Subjective:     Beth Cox is a 37 y.o. female who presents to the clinic  4 days  status post low uterine, transverse cesarean section. Pt reports incision is covered with honeycomb.     Objective:    LMP 07/16/2022 (Approximate)  General:  alert, well appearing, in no apparent distress  Incision:   no drainage, still covered     Assessment:    Doing well postoperatively.   Plan:   1. Continue any current medications. 2. Wound care discussed. 3. Follow up: on Monday as today was too early for true incision check. Scheryl Marten, RN

## 2023-04-18 ENCOUNTER — Ambulatory Visit (INDEPENDENT_AMBULATORY_CARE_PROVIDER_SITE_OTHER): Payer: BC Managed Care – PPO | Admitting: *Deleted

## 2023-04-18 VITALS — BP 121/79 | HR 81

## 2023-04-18 DIAGNOSIS — Z98891 History of uterine scar from previous surgery: Secondary | ICD-10-CM

## 2023-04-18 NOTE — Progress Notes (Signed)
Subjective:     Beth Cox is a 37 y.o. female who presents to the clinic 1 weeks status post low uterine, transverse cesarean section. Pt reports incision is covered.      Objective:    LMP 07/16/2022 (Approximate)  General:  alert, well appearing, in no apparent distress  Incision:   healing well, no drainage, no erythema, no hernia, no seroma, no swelling, no dehiscence, incision well approximated     Assessment:    Doing well postoperatively.   Plan:    1. Continue any current medications. 2. Wound care discussed. 3. Follow up: as needed and or at postpartum visit.   Scheryl Marten, RN

## 2023-04-21 ENCOUNTER — Ambulatory Visit: Payer: BC Managed Care – PPO

## 2023-05-27 ENCOUNTER — Ambulatory Visit (INDEPENDENT_AMBULATORY_CARE_PROVIDER_SITE_OTHER): Payer: BC Managed Care – PPO | Admitting: Obstetrics and Gynecology

## 2023-05-27 ENCOUNTER — Encounter: Payer: Self-pay | Admitting: Obstetrics and Gynecology

## 2023-05-27 VITALS — BP 108/73 | HR 62 | Wt 205.0 lb

## 2023-05-27 DIAGNOSIS — D649 Anemia, unspecified: Secondary | ICD-10-CM

## 2023-05-27 NOTE — Progress Notes (Signed)
    Post Partum Visit Note  Beth Cox is a 37 y.o. [id number] s/p scheduled repeat 4/15 c-section and BTL at 39wks. Pregnancy c/b AMA. Anesthesia: spinal. Postpartum course has been uncomplicated. Baby is doing well. Baby is feeding by bottle - Similac Sensitive RS. Bleeding  cycle came on Saturday of last week . Bowel function is normal. Bladder function is normal. Patient is not sexually active. Postpartum depression screening: negative.     Edinburgh Postnatal Depression Scale - 05/27/23 1032       Edinburgh Postnatal Depression Scale:  In the Past 7 Days   I have been able to laugh and see the funny side of things. 0    I have looked forward with enjoyment to things. 0    I have blamed myself unnecessarily when things went wrong. 1    I have been anxious or worried for no good reason. 1    I have felt scared or panicky for no good reason. 0    Things have been getting on top of me. 1    I have been so unhappy that I have had difficulty sleeping. 0    I have felt sad or miserable. 1    I have been so unhappy that I have been crying. 1    The thought of harming myself has occurred to me. 0    Edinburgh Postnatal Depression Scale Total 5             Review of Systems A comprehensive review of systems was negative.  Objective:  BP 108/73   Pulse 62   Wt 205 lb (93 kg)   LMP 05/21/2023   Breastfeeding No   BMI 32.11 kg/m    General: NAD Abdomen: soft, nttp, nd. C/d/I incision, looks great.   Assessment:   Normal PP exam.   Plan:   *PP: patient doing well. She wasn't able to tolerate PO iron. Will recheck cbc today, pt asymptomatic; last hospital hgb 8.2  Negative pap 09/2022  [place] Bing, MD Center for [facility], [facility]

## 2023-05-28 LAB — CBC
Hematocrit: 34.5 % (ref 34.0–46.6)
Hemoglobin: 10.6 g/dL — ABNORMAL LOW (ref 11.1–15.9)
MCH: 24.7 pg — ABNORMAL LOW (ref 26.6–33.0)
MCHC: 30.7 g/dL — ABNORMAL LOW (ref 31.5–35.7)
MCV: 80 fL (ref 79–97)
Platelets: 391 10*3/uL (ref 150–450)
RBC: 4.29 x10E6/uL (ref 3.77–5.28)
RDW: 19.3 % — ABNORMAL HIGH (ref 11.7–15.4)
WBC: 6.6 10*3/uL (ref 3.4–10.8)

## 2023-06-23 IMAGING — US US THYROID
1 series · 14 of 25 positions shown · non-contrast
Comparison: Prior thyroid ultrasound 08/04/2020

CLINICAL DATA: Goiter.

EXAM:
THYROID ULTRASOUND
TECHNIQUE: Ultrasound examination of the thyroid gland and adjacent soft
tissues was performed.

[Series 1: us thyroid · 0.07mm/px · 72 acquisitions, 14 frames shown]
[im 1/72]
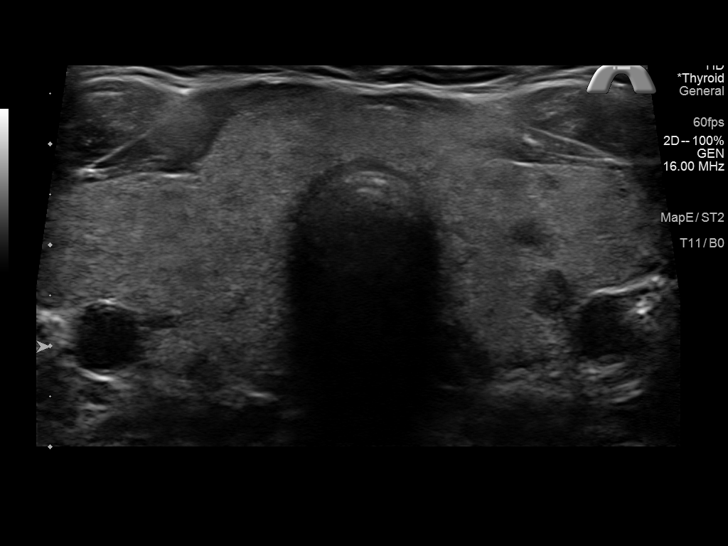
[im 6/72]
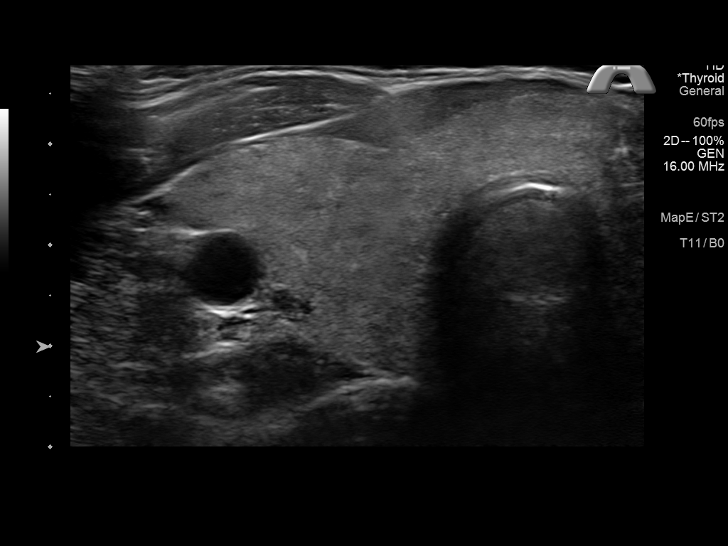
[im 12/72]
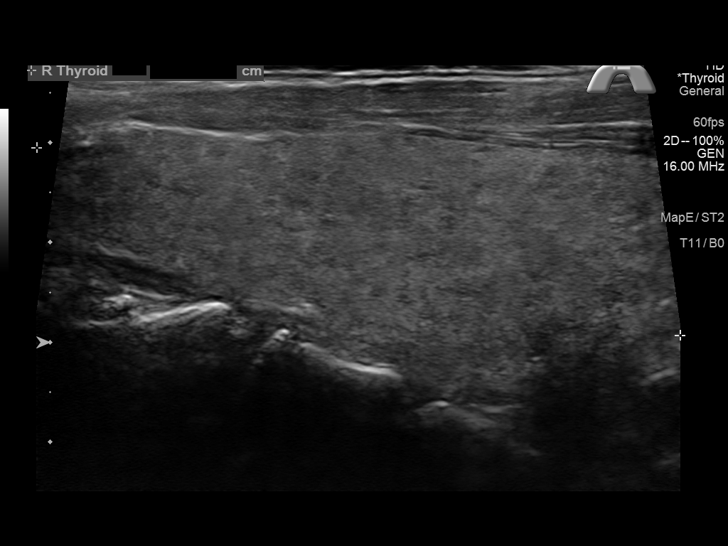
[im 18/72]
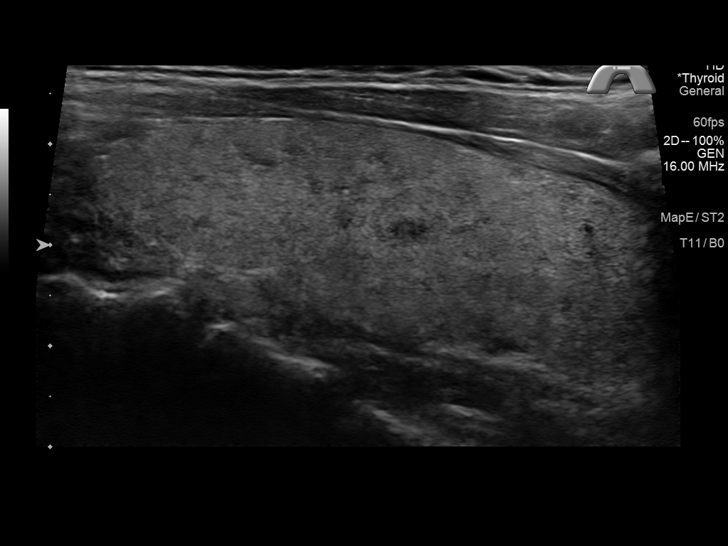
[im 24/72]
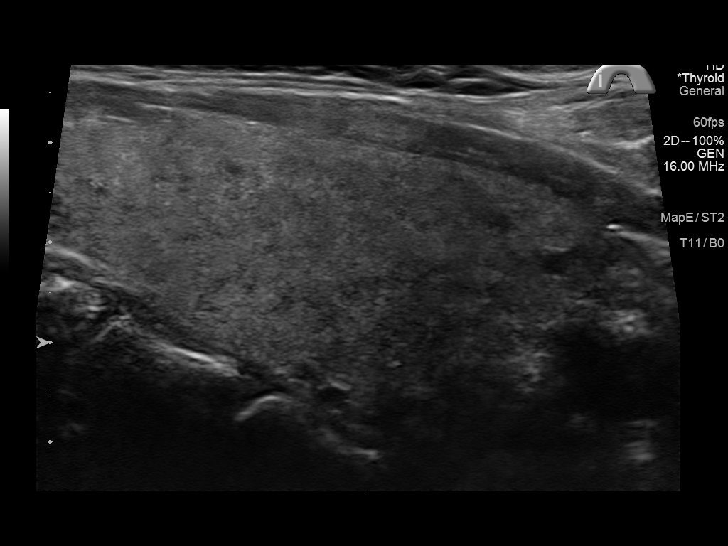
[im 27/72]
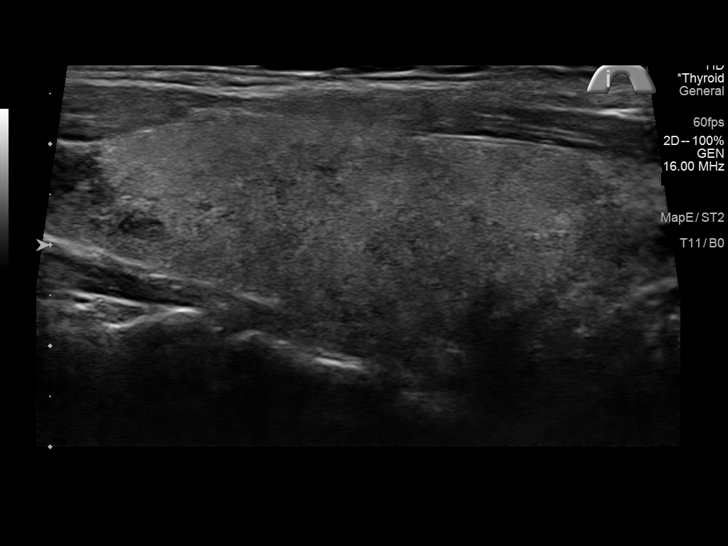
[im 33/72]
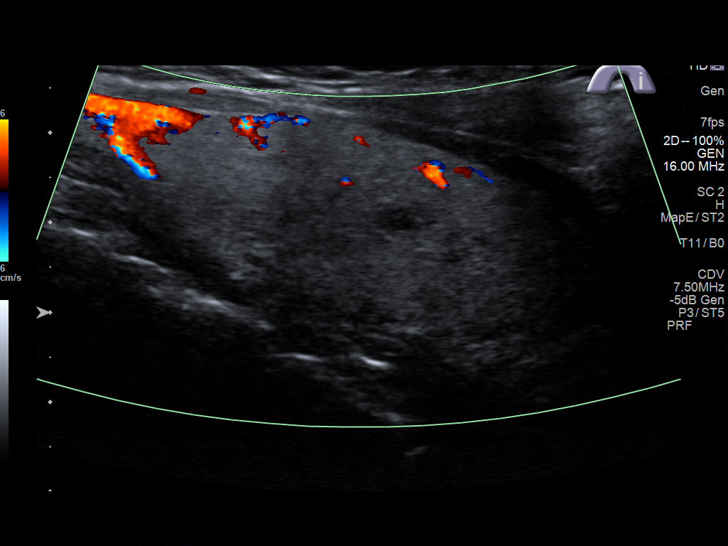
[im 39/72]
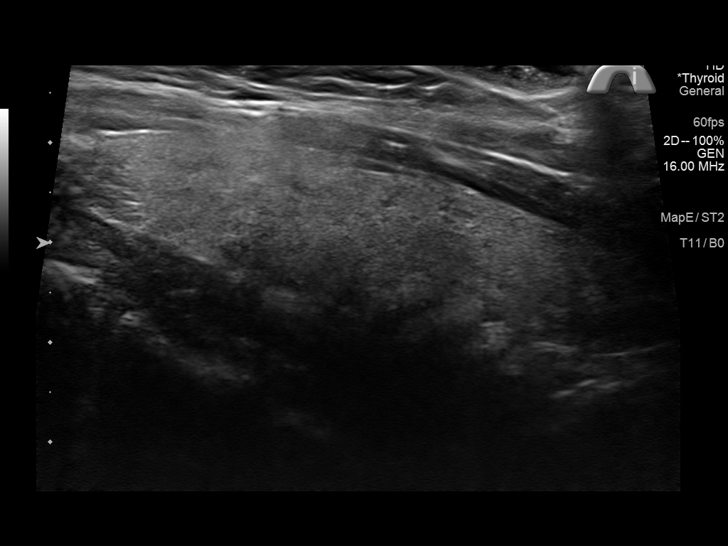
[im 45/72]
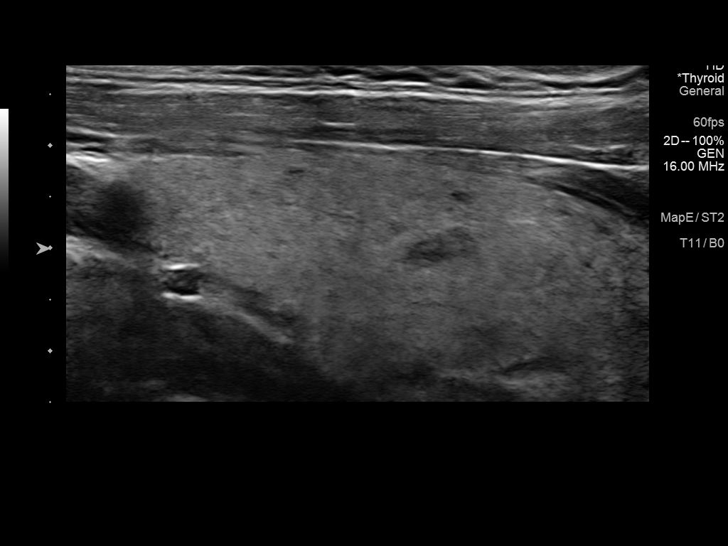
[im 48/72]
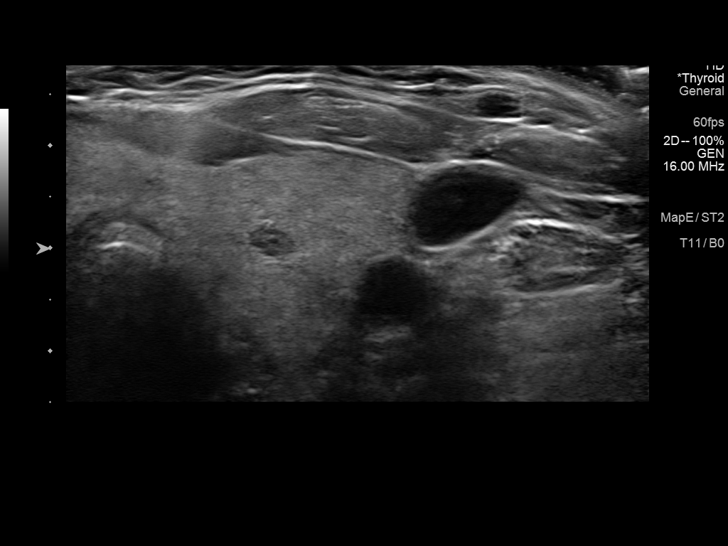
[im 54/72]
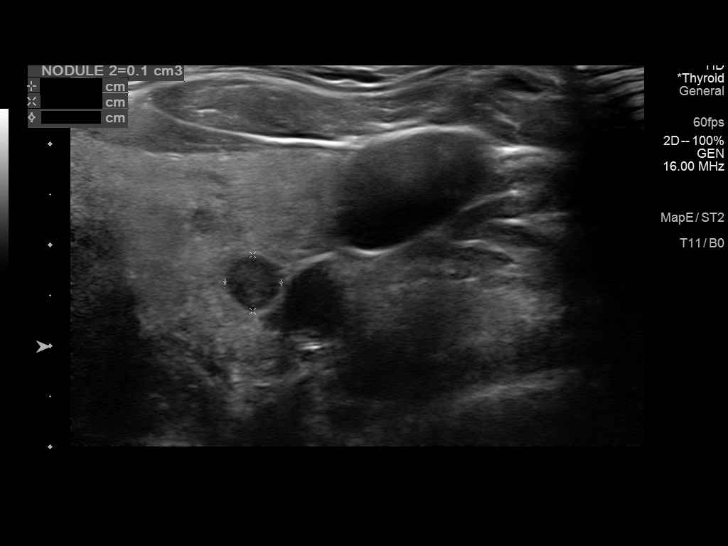
[im 60/72]
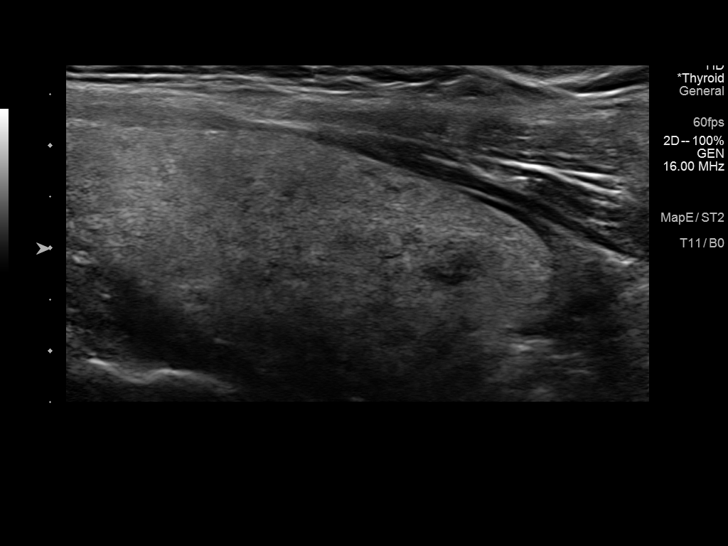
[im 66/72]
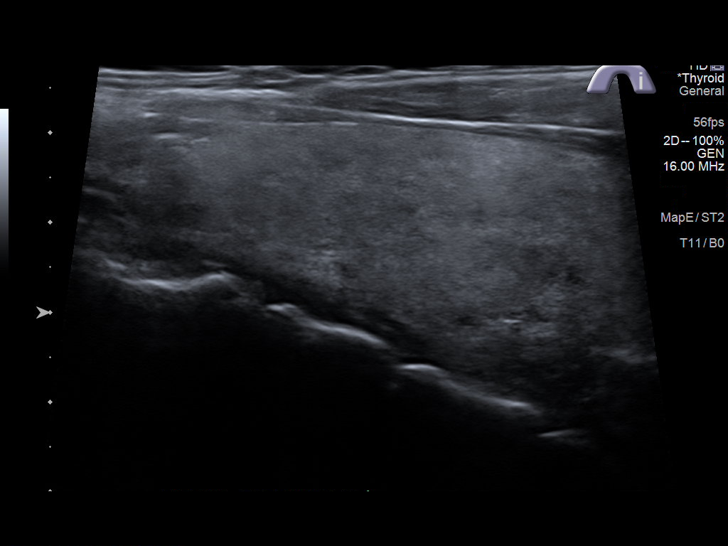
[im 72/72]
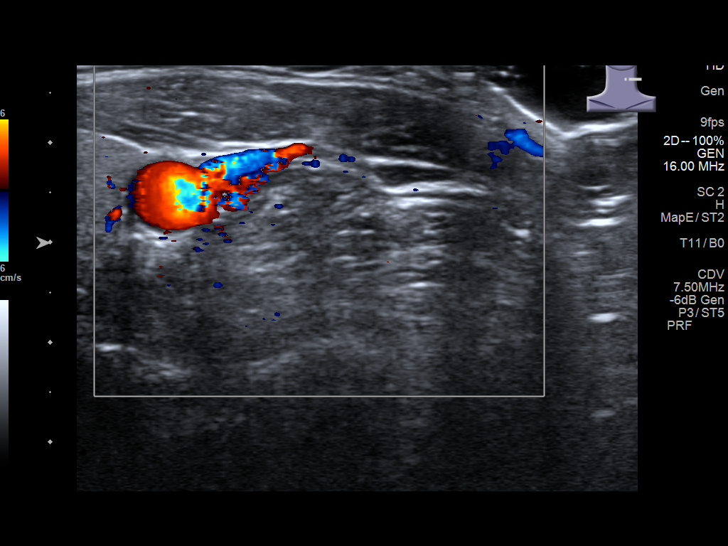

[14 of 25 positions shown; findings below may reference images not displayed]

FINDINGS: Parenchymal Echotexture: Moderately heterogenous

Isthmus: 0.8 cm

Right lobe: 6.7 x 6.0 x 2.6 cm

Left lobe: 5.9 x 2.9 x 2.4 cm

_________________________________________________________

Estimated total number of nodules >/= 1 cm: 0

Number of spongiform nodules >/=  2 cm not described below (TR1): 0

Number of mixed cystic and solid nodules >/= 1.5 cm not described
below (TR2): 0

_________________________________________________________

No discrete nodules of consequence are seen within the thyroid
gland. Multiple tiny subcentimeter nodules identified in the left
mid and lower gland. None of these meet criteria to warrant further
evaluation.
IMPRESSION: Heterogeneous and enlarged thyroid gland most consistent with
diffuse goitrous change.

No thyroid nodules of consequence identified.

The above is in keeping with the ACR TI-RADS recommendations - [HOSPITAL] 8783;[DATE].

## 2024-06-07 ENCOUNTER — Ambulatory Visit: Admitting: Obstetrics and Gynecology

## 2024-06-14 ENCOUNTER — Ambulatory Visit: Admitting: Obstetrics and Gynecology

## 2024-07-26 ENCOUNTER — Ambulatory Visit: Admitting: Nurse Practitioner

## 2024-08-09 ENCOUNTER — Ambulatory Visit: Admitting: Family Medicine

## 2024-09-20 ENCOUNTER — Ambulatory Visit: Admitting: Obstetrics & Gynecology

## 2024-09-20 ENCOUNTER — Encounter: Payer: Self-pay | Admitting: Obstetrics & Gynecology

## 2024-09-20 VITALS — BP 107/72 | HR 78 | Ht 67.0 in | Wt 181.0 lb

## 2024-09-20 DIAGNOSIS — N943 Premenstrual tension syndrome: Secondary | ICD-10-CM | POA: Diagnosis not present

## 2024-09-20 NOTE — Progress Notes (Signed)
 GYNECOLOGY OFFICE VISIT NOTE  History:  Beth Cox is a 38 y.o. [id number] here today for discussion about management of symptoms that occur days prior to her menstrual periods; has been occurring since last year. Reports having nausea, mood swings, cramps, fatigue and being sensitive to smells. She is most concerned about the moods, feels that she is a bad mood for days.  She denies any abnormal vaginal discharge, bleeding, pelvic pain or other concerns.  Past Medical History:  Diagnosis Date   Allergy    Anxiety, mild 06/04/2015   Fibromyalgia syndrome 09/23/2015   Migraine    Multinodular goiter 08/05/2020    Past Surgical History:  Procedure Laterality Date   CESAREAN SECTION N/A 07/13/2019   Procedure: CESAREAN SECTION;  Surgeon: Ward, Mitzie BROCKS, MD;  Location: [facility] ORS;  Service: Obstetrics;  Laterality: N/A;   CESAREAN SECTION N/A 04/11/2023   Procedure: CESAREAN SECTION;  Surgeon: Lola Donnice HERO, MD;  Location: [facility] ORS;  Service: Obstetrics;  Laterality: N/A;    The following portions of the patient's history were reviewed and updated as appropriate: allergies, current medications, past family history, past medical history, past social history, past surgical history and problem list.   Health Maintenance:  Normal pap and negative HRHPV on 10/06/2022.  Review of Systems:  Pertinent items noted in HPI and remainder of comprehensive ROS otherwise negative.  Physical Exam:  BP 107/72   Pulse 78   Ht 5' 7 (1.702 m)   Wt 181 lb (82.1 kg)   LMP 08/27/2024   BMI 28.35 kg/m  CONSTITUTIONAL: Well-developed, well-nourished female in no acute distress.  HEENT:  Normocephalic, atraumatic. External right and left ear normal. No scleral icterus.  NECK: Normal range of motion, supple, no masses noted on observation SKIN: No rash noted. Not diaphoretic. No erythema. No pallor. MUSCULOSKELETAL: Normal range of motion. No edema noted. NEUROLOGIC: Alert and oriented to  person, place, and time. Normal muscle tone coordination. No cranial nerve deficit noted on observation. PSYCHIATRIC: Normal mood and affect. Normal behavior. Normal judgment and thought content. CARDIOVASCULAR: Normal heart rate noted RESPIRATORY: Effort and breath sounds normal, no problems with respiration noted ABDOMEN: No masses or other overt distention noted on observation. No tenderness.   PELVIC: Deferred  Assessment and Plan:    1. Premenstrual syndrome (Primary) Discussed premenstrual syndrome, and management modalities. Discussed lifestyle interventions (yoga, meditation, exercise etc), hormonal medications (OCPs such as Yaz) or SSRIs such as Prozac.  Patient also wanted to discuss vitamins and supplements that can help; there is evidence that St John's wort, primrose oil and other supplements can help; but patient was told I was not educated about these options and cannot give expert opinion. Patient wants to try supplements first, if no response, she wants Prozac (will send MyChart message if she wants this).  Wants to avoid hormonal medications and these caused worsening moods and headaches in the past.   Routine preventative health maintenance measures emphasized. Please refer to After Visit Summary for other counseling recommendations.   Return for follow up as needed.    I spent 30 minutes dedicated to the care of this patient including pre-visit review of records, face to face time with the patient discussing her conditions and treatments, post visit ordering of medications and appropriate tests or procedures, coordinating care and documenting this visit encounter.    GLORIS HUGGER, MD, FACOG Obstetrician & Gynecologist, [facility] for [employer], [facility]

## 2024-09-27 ENCOUNTER — Encounter: Payer: Self-pay | Admitting: Family Medicine

## 2024-09-27 ENCOUNTER — Ambulatory Visit: Admitting: Family Medicine

## 2024-09-27 VITALS — BP 118/74 | HR 75 | Resp 16 | Ht 67.0 in | Wt 179.0 lb

## 2024-09-27 DIAGNOSIS — R202 Paresthesia of skin: Secondary | ICD-10-CM | POA: Diagnosis not present

## 2024-09-27 DIAGNOSIS — M7661 Achilles tendinitis, right leg: Secondary | ICD-10-CM

## 2024-09-27 DIAGNOSIS — L299 Pruritus, unspecified: Secondary | ICD-10-CM

## 2024-09-27 DIAGNOSIS — N920 Excessive and frequent menstruation with regular cycle: Secondary | ICD-10-CM

## 2024-09-27 DIAGNOSIS — E559 Vitamin D deficiency, unspecified: Secondary | ICD-10-CM

## 2024-09-27 DIAGNOSIS — F3281 Premenstrual dysphoric disorder: Secondary | ICD-10-CM

## 2024-09-27 DIAGNOSIS — R200 Anesthesia of skin: Secondary | ICD-10-CM | POA: Diagnosis not present

## 2024-09-27 DIAGNOSIS — M2550 Pain in unspecified joint: Secondary | ICD-10-CM

## 2024-09-27 DIAGNOSIS — M79671 Pain in right foot: Secondary | ICD-10-CM

## 2024-09-27 DIAGNOSIS — M797 Fibromyalgia: Secondary | ICD-10-CM

## 2024-09-27 DIAGNOSIS — R1310 Dysphagia, unspecified: Secondary | ICD-10-CM

## 2024-09-27 DIAGNOSIS — M542 Cervicalgia: Secondary | ICD-10-CM

## 2024-09-27 DIAGNOSIS — E069 Thyroiditis, unspecified: Secondary | ICD-10-CM

## 2024-09-27 DIAGNOSIS — D649 Anemia, unspecified: Secondary | ICD-10-CM

## 2024-09-27 NOTE — Patient Instructions (Signed)
 Central Imaging Scheduling: Ph [phone]

## 2024-09-27 NOTE — Progress Notes (Unsigned)
 Patient ID: Beth Cox, female    DOB: 22-Oct-1986, 38 y.o.   MRN: [id number]  PCP: Leavy Mole, PA-C  Chief Complaint  Patient presents with   Consult    Feels she's having tingling/numbness possibly related to her Fibromyalgia. Inner itching sensation on body.   Tendonitis    B/L x1.5 yrs since birth of son. Walks/jogs but doesn't help.   Numbness    R 2nd/3rd toes, x1 yr    Subjective:   Beth Cox is a 38 y.o. female, presents to clinic with CC of the following:  HPI   Itching, discomfort tingling all over no relief, multiple sx all over body, just concerned for general health, check for deficiencies, past anemia, iron deficiency, perimenopausal sx?  Also complains of Tendon/achilles pain bilateral no injury, she's lost weight, wonders if its FMS or something else  Hx of vit d deficiency Last vitamin D  Lab Results  Component Value Date   VD25OH 52 (L) 07/15/2016   Lab Results  Component Value Date   HGBA1C 5.3 10/06/2022    She's watched a lot of her sx and concerns since delivering and now that its been 18+ months she is seeking eval - she's lost weight  Wt Readings from Last 5 Encounters:  09/27/24 179 lb (81.2 kg)  09/20/24 181 lb (82.1 kg)  05/27/23 205 lb (93 kg)  04/11/23 226 lb (102.5 kg)  03/30/23 226 lb (102.5 kg)   BMI Readings from Last 5 Encounters:  09/27/24 28.04 kg/m  09/20/24 28.35 kg/m  05/27/23 32.11 kg/m  04/11/23 35.40 kg/m  03/30/23 35.40 kg/m    Also neck pain, intermittent swelling tenderness, PCP used to watch her thyroid  with labs and US , when it feels full it feels difficult to swallow   Patient Active Problem List   Diagnosis Date Noted   Multinodular goiter 08/05/2020   Positive ANA (antinuclear antibody) 07/22/2016   Fibromyalgia syndrome 09/23/2015   Chronic pelvic pain in female 09/23/2015   Allergic rhinitis with postnasal drip 07/25/2015   Migraine without aura and without status migrainosus,  not intractable      No current outpatient medications on file.   Allergies  Allergen Reactions   Peanuts  [Peanut Oil] Shortness Of Breath   Shellfish Allergy Swelling     Social History   Tobacco Use   Smoking status: Never   Smokeless tobacco: Never  Vaping Use   Vaping status: Never Used  Substance Use Topics   Alcohol use: No    Alcohol/week: 0.0 standard drinks of alcohol   Drug use: No      Chart Review Today: I personally reviewed active problem list, medication list, allergies, family history, social history, health maintenance, notes from last encounter, lab results, imaging with the patient/caregiver today.   Review of Systems  Constitutional: Negative.   HENT: Negative.    Eyes: Negative.   Respiratory: Negative.    Cardiovascular: Negative.   Gastrointestinal: Negative.   Endocrine: Negative.   Genitourinary: Negative.   Musculoskeletal: Negative.   Skin: Negative.   Allergic/Immunologic: Negative.   Neurological: Negative.   Hematological: Negative.   Psychiatric/Behavioral: Negative.    All other systems reviewed and are negative.      Objective:   Vitals:   09/27/24 0944  BP: 118/74  Pulse: 75  Resp: 16  SpO2: 98%  Weight: 179 lb (81.2 kg)  Height: 5' 7 (1.702 m)    Body mass index is 28.04 kg/m.  Physical Exam  Vitals and nursing note reviewed.  Constitutional:      General: She is not in acute distress.    Appearance: Normal appearance. She is well-developed. She is not ill-appearing, toxic-appearing or diaphoretic.  HENT:     Head: Normocephalic and atraumatic.     Right Ear: External ear normal.     Left Ear: External ear normal.     Nose: Nose normal.  Eyes:     General: No scleral icterus.       Right eye: No discharge.        Left eye: No discharge.     Conjunctiva/sclera: Conjunctivae normal.  Neck:     Thyroid : Thyromegaly present. No thyroid  mass or thyroid  tenderness.     Trachea: Trachea and phonation  normal. No tracheal deviation.  Cardiovascular:     Rate and Rhythm: Normal rate.  Pulmonary:     Effort: Pulmonary effort is normal. No respiratory distress.     Breath sounds: No stridor.  Musculoskeletal:     Cervical back: Normal range of motion.  Skin:    General: Skin is warm and dry.     Findings: No rash.  Neurological:     Mental Status: She is alert.     Motor: No abnormal muscle tone.     Coordination: Coordination normal.     Gait: Gait normal.  Psychiatric:        Mood and Affect: Mood normal.        Behavior: Behavior normal.      Results for orders placed or performed in visit on 05/27/23  CBC   Collection Time: 05/27/23 11:22 AM  Result Value Ref Range   WBC 6.6 3.4 - 10.8 x10E3/uL   RBC 4.29 3.77 - 5.28 x10E6/uL   Hemoglobin 10.6 (L) 11.1 - 15.9 g/dL   Hematocrit 65.4 65.9 - 46.6 %   MCV 80 79 - 97 fL   MCH 24.7 (L) 26.6 - 33.0 pg   MCHC 30.7 (L) 31.5 - 35.7 g/dL   RDW 80.6 (H) 88.2 - 84.5 %   Platelets 391 150 - 450 x10E3/uL       Assessment & Plan:   1. Fibromyalgia syndrome (Primary) Many of her CC today are atypical of FMS - evaluating for other etiologies  2. Numbness of toes, right 2nd and 3rd She is concerned with numbness/tingling and heel pain - ref to podiatry - Iron, TIBC and Ferritin Panel - Vitamin B12 - Ambulatory referral to Podiatry  3. Tingling sensation, all over body Difficult to describe sx all over body R/o deficiencies and hypothyroid - Iron, TIBC and Ferritin Panel - Vitamin B12   4. Ankle pain bilateral -  Ref to podiatry - CBC with Differential/Platelet - Analyzer ANA IFA w/RFLX Titer/Pattern,Systemic Autoimmune Panel 1  5. Thyroiditis Sounds like hx of intermittent thyroiditis?  Thyromegaly on exam w/o tenderness or nodules/masses Labs, f/up imaging and ENT consult with swallowing concerns/sx - Thyroid  Panel With TSH - Thyroid  antibodies (Thyroperoxidase & Thyroglobulin) - US  THYROID  - Ambulatory referral to  ENT  6. Neck pain See #5 - Thyroid  Panel With TSH - Thyroid  antibodies (Thyroperoxidase & Thyroglobulin) - US  THYROID  - Ambulatory referral to ENT  7. Polyarthralgia She's done autoimmune labs in the past with Dr. Hinton but that wsa 2017 or so - with multiple sx repeat labs with panel and recheck thyroid  function - Thyroid  Panel With TSH - Analyzer ANA IFA w/RFLX Titer/Pattern,Systemic Autoimmune Panel 1  8. Vitamin D  deficiency Last labs  were near normal but were many years ago, recheck - Comprehensive metabolic panel with GFR - VITAMIN D  25 Hydroxy (Vit-D Deficiency, Fractures)  9. Pruritic condition  - CBC with Differential/Platelet  10. Anemia, unspecified type Hx of anemia, last labs H/H good, recheck  - CBC with Differential/Platelet - Iron, TIBC and Ferritin Panel - Vitamin B12  11. Heel pain, bilateral  - Ambulatory referral to Podiatry  12. Dysphagia, unspecified type See above - US  THYROID  - Ambulatory referral to ENT  13. Menorrhagia with regular cycle Recent discussion/OV with Gyn Poor tolerance in the past with OCPs Currently trying holistic approach and supplements, but sx worsening, sig mood sx, physical sx, she reports maybe 1 week a month where she feels normal or good - Gyn has discussed non-hormonal options as well if her current supplements dont help  14. PMDD (premenstrual dysphoric disorder)?  Vs perimenopausal sx?       Michelene Cower, PA-C 09/27/24 10:09 AM

## 2024-09-28 ENCOUNTER — Ambulatory Visit: Payer: Self-pay | Admitting: Family Medicine

## 2024-09-28 DIAGNOSIS — E559 Vitamin D deficiency, unspecified: Secondary | ICD-10-CM

## 2024-09-28 DIAGNOSIS — D509 Iron deficiency anemia, unspecified: Secondary | ICD-10-CM

## 2024-09-28 MED ORDER — VITAMIN D (ERGOCALCIFEROL) 1.25 MG (50000 UNIT) PO CAPS
50000.0000 [IU] | ORAL_CAPSULE | ORAL | 1 refills | Status: AC
Start: 2024-09-28 — End: ?

## 2024-10-03 LAB — CBC WITH DIFFERENTIAL/PLATELET
Absolute Lymphocytes: 2118 {cells}/uL (ref 850–3900)
Absolute Monocytes: 365 {cells}/uL (ref 200–950)
Basophils Absolute: 51 {cells}/uL (ref 0–200)
Basophils Relative: 0.8 %
Eosinophils Absolute: 51 {cells}/uL (ref 15–500)
Eosinophils Relative: 0.8 %
HCT: 28.7 % — ABNORMAL LOW (ref 35.0–45.0)
Hemoglobin: 8.5 g/dL — ABNORMAL LOW (ref 11.7–15.5)
MCH: 23.7 pg — ABNORMAL LOW (ref 27.0–33.0)
MCHC: 29.6 g/dL — ABNORMAL LOW (ref 32.0–36.0)
MCV: 80.2 fL (ref 80.0–100.0)
MPV: 10.6 fL (ref 7.5–12.5)
Monocytes Relative: 5.7 %
Neutro Abs: 3814 {cells}/uL (ref 1500–7800)
Neutrophils Relative %: 59.6 %
Platelets: 453 Thousand/uL — ABNORMAL HIGH (ref 140–400)
RBC: 3.58 Million/uL — ABNORMAL LOW (ref 3.80–5.10)
RDW: 14.1 % (ref 11.0–15.0)
Total Lymphocyte: 33.1 %
WBC: 6.4 Thousand/uL (ref 3.8–10.8)

## 2024-10-03 LAB — ANALYZER(R)ANA IFA WITH REFLEX TITER/PATTRN,SYS AUTOIMM PNL1
Anticardiolipin IgM: <2 [MPL'U]/mL
Beta-2 Glyco 1 IgA: <2 [GPL'U]/mL
Beta-2 Glyco 1 IgM: <2 U/mL
Beta-2 Glyco I IgG: <2 [MPL'U]/mL
C3 Complement: <145 mg/dL (ref 83–193)
C4 Complement: <37 mg/dL (ref 15–57)
Centromere Ab Screen: <37 mg/dL (ref 15–57)
Chromatin (Nucleosomal) Antibody: <1 — AB
Cyclic Citrullin Peptide Ab: <16 U
ENA SM Ab Ser-aCnc: <1
Jo-1 Autoabs: <145 mg/dL (ref 83–193)
MUTATED CITRULLINATED VIMENTIN (MCV) AB: <20 U/mL (ref <20)
Rheumatoid Factor (IgA): <5 U/mL
Rheumatoid Factor (IgG): <20 U/mL (ref <20)
Rheumatoid Factor (IgG): <5 U/mL
Rheumatoid Factor (IgM): <5 U
Ribonucleic Protein(ENA) Antibody, IgG: <1 AI
SM/RNP: <1 AI
SSA (Ro) (ENA) Antibody, IgG: <1 AI
SSB (La) (ENA) Antibody, IgG: <1 AI
SSB (La) (ENA) Antibody, IgG: <1 AI
Scleroderma (Scl-70) (ENA) Antibody, IgG: <1 AI
Scleroderma (Scl-70) (ENA) Antibody, IgG: <1 AI
Thyroperoxidase Ab SerPl-aCnc: <900 [IU]/mL — AB (ref <9)
Thyroperoxidase Ab SerPl-aCnc: >900 [IU]/mL — ABNORMAL HIGH (ref <9)

## 2024-10-03 LAB — IRON,TIBC AND FERRITIN PANEL
%SAT: 5 % — ABNORMAL LOW (ref 16–45)
Ferritin: 5 ng/mL — ABNORMAL LOW (ref 16–154)
Iron: 22 ug/dL — ABNORMAL LOW (ref 40–190)
TIBC: 462 ug/dL — ABNORMAL HIGH (ref 250–450)

## 2024-10-03 LAB — COMPREHENSIVE METABOLIC PANEL WITH GFR
AG Ratio: 1.2 (calc) (ref 1.0–2.5)
ALT: 9 U/L (ref 6–29)
AST: 14 U/L (ref 10–30)
Albumin: 4.4 g/dL (ref 3.6–5.1)
Alkaline phosphatase (APISO): 48 U/L (ref 31–125)
BUN: 12 mg/dL (ref 7–25)
CO2: 27 mmol/L (ref 20–32)
Calcium: 9.9 mg/dL (ref 8.6–10.2)
Chloride: 104 mmol/L (ref 98–110)
Creat: 0.66 mg/dL (ref 0.50–0.97)
Globulin: 3.7 g/dL (ref 1.9–3.7)
Glucose, Bld: 69 mg/dL (ref 65–99)
Potassium: 4.6 mmol/L (ref 3.5–5.3)
Sodium: 137 mmol/L (ref 135–146)
Total Bilirubin: 0.3 mg/dL (ref 0.2–1.2)
Total Protein: 8.1 g/dL (ref 6.1–8.1)
eGFR: 115 mL/min/1.73m2 (ref >=60)

## 2024-10-03 LAB — ANTI-NUCLEAR AB-TITER (ANA TITER): ANA Titer 1: 1:160 {titer} — ABNORMAL HIGH

## 2024-10-03 LAB — VITAMIN B12: Vitamin B-12: 673 pg/mL (ref 200–1100)

## 2024-10-03 LAB — THYROGLOBULIN ANTIBODY: Thyroglobulin Ab: <1 [IU]/mL (ref <=1)

## 2024-10-03 LAB — VITAMIN D 25 HYDROXY (VIT D DEFICIENCY, FRACTURES): Vit D, 25-Hydroxy: 11 ng/mL — ABNORMAL LOW (ref 30–100)

## 2024-10-03 LAB — THYROID PANEL WITH TSH
Free Thyroxine Index: 2.3 (ref 1.4–3.8)
T3 Uptake: 28 % (ref 22–35)
T4, Total: 8.2 ug/dL (ref 5.1–11.9)
TSH: 0.55 m[IU]/L

## 2024-10-05 ENCOUNTER — Ambulatory Visit: Payer: Self-pay | Admitting: Podiatry

## 2024-10-12 ENCOUNTER — Inpatient Hospital Stay

## 2024-10-12 ENCOUNTER — Ambulatory Visit
Admission: RE | Admit: 2024-10-12 | Discharge: 2024-10-12 | Disposition: A | Source: Ambulatory Visit | Attending: Family Medicine

## 2024-10-12 ENCOUNTER — Encounter: Payer: Self-pay | Admitting: Oncology

## 2024-10-12 ENCOUNTER — Inpatient Hospital Stay: Attending: Oncology | Admitting: Oncology

## 2024-10-12 VITALS — BP 121/89 | HR 90 | Temp 97.7°F | Resp 19 | Ht 67.0 in | Wt 180.1 lb

## 2024-10-12 DIAGNOSIS — E069 Thyroiditis, unspecified: Secondary | ICD-10-CM | POA: Insufficient documentation

## 2024-10-12 DIAGNOSIS — D509 Iron deficiency anemia, unspecified: Secondary | ICD-10-CM | POA: Insufficient documentation

## 2024-10-12 DIAGNOSIS — M542 Cervicalgia: Secondary | ICD-10-CM | POA: Diagnosis not present

## 2024-10-12 DIAGNOSIS — R221 Localized swelling, mass and lump, neck: Secondary | ICD-10-CM | POA: Diagnosis not present

## 2024-10-12 DIAGNOSIS — Z801 Family history of malignant neoplasm of trachea, bronchus and lung: Secondary | ICD-10-CM | POA: Insufficient documentation

## 2024-10-12 DIAGNOSIS — M797 Fibromyalgia: Secondary | ICD-10-CM | POA: Diagnosis not present

## 2024-10-12 DIAGNOSIS — N920 Excessive and frequent menstruation with regular cycle: Secondary | ICD-10-CM | POA: Diagnosis not present

## 2024-10-12 DIAGNOSIS — D500 Iron deficiency anemia secondary to blood loss (chronic): Secondary | ICD-10-CM | POA: Diagnosis present

## 2024-10-12 DIAGNOSIS — D508 Other iron deficiency anemias: Secondary | ICD-10-CM | POA: Diagnosis not present

## 2024-10-12 DIAGNOSIS — R1310 Dysphagia, unspecified: Secondary | ICD-10-CM | POA: Insufficient documentation

## 2024-10-12 NOTE — Progress Notes (Signed)
 New patient; Referral for Iron deficiency anemia.

## 2024-10-12 NOTE — Progress Notes (Signed)
 Hematology/Oncology Consult note [facility] [phone] [phone]  Patient Care Team: Leavy Mole, PA-C as PCP - General (Family Medicine) Fredirick Glenys RAMAN, MD as PCP - OBGYN (Obstetrics and Gynecology) Richarda Shell, MD (Family Medicine)   Name of the patient: Beth Cox  [id number]  03-24-1986    Reason for referral- iron deficiency anemia   Referring physician- Mole Leavy  Date of visit: 10/12/24   History of presenting illness-patient is a 38 year old female with no significant past medical history referred for iron Deficiency anemia labs from 09/27/2024 showed white count of 6.4, H&H of 8.5/28.7 with an MCV of 80.2 and platelet count of 453.  CMP was within normal limits.  Ferritin levels were low at 5 with an iron saturation of 5% and elevated TIBC of 462.  B12 levels were normal at 673.  TSH normal.  Looking back at her prior CBCs her hemoglobin was 10.6 a year ago in May 2024 and 11 in September 2023.  She has tried iron pills during pregnancy and at other times, but they consistently made her feel unwell.  Her menstrual cycles have been heavier since having her fallopian tubes removed 18 months ago. She takes ibuprofen  every other day for fibromyalgia flare-ups. No consistent blood in stools over the past year, though she recalls seeing blood sporadically in the past, particularly after childbirth.  There is a family history of colon cancer, with her paternal grandfather having had multiple cancers, including colon cancer, likely in his fifties or sixties.  ECOG PS- 0  Pain scale- 0   Review of systems- Review of Systems  Constitutional:  Negative for chills, fever, malaise/fatigue and weight loss.  HENT:  Negative for congestion, ear discharge and nosebleeds.   Eyes:  Negative for blurred vision.  Respiratory:  Negative for cough, hemoptysis, sputum production, shortness of breath and wheezing.   Cardiovascular:   Negative for chest pain, palpitations, orthopnea and claudication.  Gastrointestinal:  Negative for abdominal pain, blood in stool, constipation, diarrhea, heartburn, melena, nausea and vomiting.  Genitourinary:  Negative for dysuria, flank pain, frequency, hematuria and urgency.  Musculoskeletal:  Negative for back pain, joint pain and myalgias.  Skin:  Negative for rash.  Neurological:  Negative for dizziness, tingling, focal weakness, seizures, weakness and headaches.  Endo/Heme/Allergies:  Does not bruise/bleed easily.  Psychiatric/Behavioral:  Negative for depression and suicidal ideas. The patient does not have insomnia.     Allergies  Allergen Reactions   Peanuts  [Peanut Oil] Shortness Of Breath   Shellfish Allergy Swelling    Patient Active Problem List   Diagnosis Date Noted   Multinodular goiter 08/05/2020   Positive ANA (antinuclear antibody) 07/22/2016   Fibromyalgia syndrome 09/23/2015   Chronic pelvic pain in female 09/23/2015   Allergic rhinitis with postnasal drip 07/25/2015   Migraine without aura and without status migrainosus, not intractable      Past Medical History:  Diagnosis Date   Allergy    Anxiety, mild 06/04/2015   Fibromyalgia syndrome 09/23/2015   Migraine    Multinodular goiter 08/05/2020     Past Surgical History:  Procedure Laterality Date   CESAREAN SECTION N/A 07/13/2019   Procedure: CESAREAN SECTION;  Surgeon: Ward, Mitzie BROCKS, MD;  Location: [facility] ORS;  Service: Obstetrics;  Laterality: N/A;   CESAREAN SECTION N/A 04/11/2023   Procedure: CESAREAN SECTION;  Surgeon: Lola Donnice HERO, MD;  Location: [facility];  Service: Obstetrics;  Laterality: N/A;    Social History  Socioeconomic History   Marital status: Married    Spouse name: Narvis   Number of children: Not on file   Years of education: 18   Highest education level: Master's degree (e.g., MA, MS, MEng, MEd, MSW, MBA)  Occupational History   Occupation: Actuary     Comment: [employer]  Tobacco Use   Smoking status: Never   Smokeless tobacco: Never  Vaping Use   Vaping status: Never Used  Substance and Sexual Activity   Alcohol use: No    Alcohol/week: 0.0 standard drinks of alcohol   Drug use: No   Sexual activity: Yes    Partners: Male    Birth control/protection: Surgical  Other Topics Concern   Not on file  Social History Narrative   Not on file   Social Drivers of Health   Financial Resource Strain: Low Risk  (07/22/2024)   Overall Financial Resource Strain (CARDIA)    Difficulty of Paying Living Expenses: Not hard at all  Food Insecurity: No Food Insecurity (10/12/2024)   Hunger Vital Sign    Worried About Running Out of Food in the Last Year: Never true    Ran Out of Food in the Last Year: Never true  Transportation Needs: No Transportation Needs (10/12/2024)   PRAPARE - Administrator, Civil Service (Medical): No    Lack of Transportation (Non-Medical): No  Physical Activity: Insufficiently Active (07/22/2024)   Exercise Vital Sign    Days of Exercise per Week: 2 days    Minutes of Exercise per Session: 30 min  Stress: No Stress Concern Present (07/22/2024)   [employer] of Occupational Health - Occupational Stress Questionnaire    Feeling of Stress: Only a little  Social Connections: Socially Integrated (07/22/2024)   Social Connection and Isolation Panel    Frequency of Communication with Friends and Family: More than three times a week    Frequency of Social Gatherings with Friends and Family: Once a week    Attends Religious Services: More than 4 times per year    Active Member of [employer] or Organizations: Yes    Attends Engineer, structural: More than 4 times per year    Marital Status: Married  Catering manager Violence: Not At Risk (10/12/2024)   Humiliation, Afraid, Rape, and Kick questionnaire    Fear of Current or Ex-Partner: No    Emotionally Abused: No    Physically Abused: No     Sexually Abused: No     Family History  Problem Relation Age of Onset   Hypertension Mother    Depression Mother    Depression Father    Cancer Paternal Uncle        lung CA   Stroke Paternal Grandfather    Heart disease Paternal Grandfather    Cancer Paternal Grandfather        lung and possibly colon cancer   Breast cancer Neg Hx    Asthma Neg Hx    Diabetes Neg Hx      Current Outpatient Medications:    Vitamin D , Ergocalciferol , (DRISDOL) 1.25 MG (50000 UNIT) CAPS capsule, Take 1 capsule (50,000 Units total) by mouth every 7 (seven) days. x12 weeks., Disp: 12 capsule, Rfl: 1   Physical exam:  Vitals:   10/12/24 1055  BP: 121/89  Pulse: 90  Resp: 19  Temp: 97.7 F (36.5 C)  TempSrc: Tympanic  SpO2: 100%  Weight: 180 lb 1.6 oz (81.7 kg)  Height: 5' 7 (1.702 m)  Physical Exam Cardiovascular:     Rate and Rhythm: Normal rate and regular rhythm.     Heart sounds: Normal heart sounds.  Pulmonary:     Effort: Pulmonary effort is normal.     Breath sounds: Normal breath sounds.  Abdominal:     General: Bowel sounds are normal.     Palpations: Abdomen is soft.  Skin:    General: Skin is warm and dry.  Neurological:     Mental Status: She is alert and oriented to person, place, and time.           Latest Ref Rng & Units 09/27/2024   11:01 AM  CMP  Glucose 65 - 99 mg/dL 69   BUN 7 - 25 mg/dL 12   Creatinine 9.49 - 0.97 mg/dL 9.33   Sodium 864 - 853 mmol/L 137   Potassium 3.5 - 5.3 mmol/L 4.6   Chloride 98 - 110 mmol/L 104   CO2 20 - 32 mmol/L 27   Calcium 8.6 - 10.2 mg/dL 9.9   Total Protein 6.1 - 8.1 g/dL 8.1   Total Bilirubin 0.2 - 1.2 mg/dL 0.3   AST 10 - 30 U/L 14   ALT 6 - 29 U/L 9       Latest Ref Rng & Units 09/27/2024   11:01 AM  CBC  WBC 3.8 - 10.8 Thousand/uL 6.4   Hemoglobin 11.7 - 15.5 g/dL 8.5   Hematocrit 64.9 - 45.0 % 28.7   Platelets 140 - 400 Thousand/uL 453      Assessment and plan- Patient is a 38 y.o. female referred for  iron deficiency anemia  Iron deficiency anemia due to heavy menstrual bleeding Chronic iron deficiency anemia with hemoglobin at 8.5 g/dL and ferritin at 5 ng/mL due to heavy menstrual bleeding. Iron pills not tolerated. No significant gastrointestinal bleeding. Discussed birth control for managing heavy periods and reducing iron loss. - Administer 1 gram of IV iron infusion, brand dependent on insurance coverage.  We will plan to give her Feraheme x 2 doses.  Discussed send benefits of IV iron including all but not limited to possible risk of infusion anaphylactic reaction.  Patient understands and agrees to proceed as planned - Repeat iron studies in 2 months post-infusion. - Schedule follow-up appointment in 4 months. - Discuss with OB/GYN about long-term management of heavy menstrual bleeding, including potential use of birth control.  If iron deficiency persists despite management of her menorrhagia I will refer her to GI at that time  Frequent NSAID use Frequent ibuprofen  use for fibromyalgia flare-ups. Discussed risks of gastric ulcers and increased gastrointestinal bleeding with regular NSAID use. Advised on safer alternatives. - Advise minimizing NSAID use to reduce risk of gastric ulcers and GI bleeding. - Recommend acetaminophen  as a safer alternative for pain management.   Thank you for this kind referral and the opportunity to participate in the care of this patient   Visit Diagnosis 1. Other iron deficiency anemia     Dr. Annah Skene, MD, MPH [facility] [phone] 10/12/2024

## 2024-10-16 ENCOUNTER — Encounter: Payer: Self-pay | Admitting: Obstetrics & Gynecology

## 2024-10-16 DIAGNOSIS — N920 Excessive and frequent menstruation with regular cycle: Secondary | ICD-10-CM

## 2024-10-16 DIAGNOSIS — N943 Premenstrual tension syndrome: Secondary | ICD-10-CM

## 2024-10-16 MED ORDER — DROSPIRENONE-ETHINYL ESTRADIOL 3-0.02 MG PO TABS
1.0000 | ORAL_TABLET | Freq: Every day | ORAL | 11 refills | Status: AC
Start: 2024-10-16 — End: ?

## 2024-10-19 ENCOUNTER — Ambulatory Visit

## 2024-10-19 ENCOUNTER — Ambulatory Visit: Admitting: Podiatry

## 2024-10-19 ENCOUNTER — Encounter: Payer: Self-pay | Admitting: Podiatry

## 2024-10-19 DIAGNOSIS — M7752 Other enthesopathy of left foot: Secondary | ICD-10-CM | POA: Diagnosis not present

## 2024-10-19 DIAGNOSIS — M7751 Other enthesopathy of right foot: Secondary | ICD-10-CM

## 2024-10-19 DIAGNOSIS — M7662 Achilles tendinitis, left leg: Secondary | ICD-10-CM

## 2024-10-19 DIAGNOSIS — M7661 Achilles tendinitis, right leg: Secondary | ICD-10-CM

## 2024-10-19 NOTE — Progress Notes (Signed)
   Chief Complaint  Patient presents with   Ankle Pain    Pt is here due to bilateral ankle pain that's has been going on for a while at least 18 months, states no injuries, pain is at the front and back of the ankle, also complains of the right foot 2nd and 3rd toe numbness that she believes has been going on for at least 18 months.    HPI: 38 y.o. female presenting today as a new patient for evaluation of pain and tenderness associated to the bilateral Achilles tendons ongoing for about 18 months  Past Medical History:  Diagnosis Date   Allergy    Anxiety, mild 06/04/2015   Fibromyalgia syndrome 09/23/2015   Migraine    Multinodular goiter 08/05/2020    Past Surgical History:  Procedure Laterality Date   CESAREAN SECTION N/A 07/13/2019   Procedure: CESAREAN SECTION;  Surgeon: Ward, Mitzie BROCKS, MD;  Location: [facility] ORS;  Service: Obstetrics;  Laterality: N/A;   CESAREAN SECTION N/A 04/11/2023   Procedure: CESAREAN SECTION;  Surgeon: Lola Donnice HERO, MD;  Location: [facility];  Service: Obstetrics;  Laterality: N/A;    Allergies  Allergen Reactions   Peanuts  [Peanut Oil] Shortness Of Breath   Shellfish Allergy Swelling     Physical Exam: General: The patient is alert and oriented x3 in no acute distress.  Dermatology: Skin is warm, dry and supple bilateral lower extremities.   Vascular: Palpable pedal pulses bilaterally. Capillary refill within normal limits.  No appreciable edema.  No erythema.  Neurological: Grossly intact via light touch  Musculoskeletal Exam: No pedal deformities noted.  There is some tenderness to palpation along the Achilles tendon bilateral  Radiographic Exam B/L foot and ankle 10/19/2024.:  Normal osseous mineralization. Joint spaces preserved.  No fractures or osseous irregularities noted.  Impression: Negative  Assessment/Plan of Care: 1.  Insertional Achilles tendinitis bilateral  -Patient evaluated.  X-rays reviewed -Discussed the  pathology and etiology of Achilles tendinitis of the bilateral lower extremities. -Recommend conservative treatment and management -Recommend daily stretching exercises demonstrated today -Recommend good supportive tennis shoes and sneakers.  Advised against activities that exacerbate working the Achilles tendon such as uphill walking and treadmill on an incline -Return to clinic PRN     Beth Cox, DPM [facility]  Dr. Thresa EMERSON Cox, DPM    [address] [place], [place] [address]                Office [phone]  Fax [phone]

## 2024-10-30 ENCOUNTER — Encounter: Payer: Self-pay | Admitting: Oncology

## 2024-11-02 ENCOUNTER — Inpatient Hospital Stay

## 2024-11-08 ENCOUNTER — Inpatient Hospital Stay: Attending: Oncology

## 2024-11-08 ENCOUNTER — Other Ambulatory Visit: Payer: Self-pay | Admitting: Oncology

## 2024-11-08 VITALS — BP 105/78 | HR 93 | Temp 97.3°F | Resp 18

## 2024-11-08 DIAGNOSIS — D509 Iron deficiency anemia, unspecified: Secondary | ICD-10-CM | POA: Diagnosis not present

## 2024-11-08 DIAGNOSIS — D508 Other iron deficiency anemias: Secondary | ICD-10-CM

## 2024-11-08 MED ORDER — SODIUM CHLORIDE 0.9 % IV SOLN
510.0000 mg | INTRAVENOUS | Status: DC
  Administered 2024-11-08: 510 mg via INTRAVENOUS
  Filled 2024-11-08: qty 510

## 2024-11-08 MED ORDER — SODIUM CHLORIDE 0.9 % IV SOLN
INTRAVENOUS | Status: DC
  Filled 2024-11-08: qty 250

## 2024-11-08 NOTE — Patient Instructions (Signed)

## 2024-11-15 ENCOUNTER — Inpatient Hospital Stay

## 2024-11-15 VITALS — BP 102/67 | HR 79 | Temp 98.3°F | Resp 16

## 2024-11-15 DIAGNOSIS — D509 Iron deficiency anemia, unspecified: Secondary | ICD-10-CM | POA: Diagnosis not present

## 2024-11-15 DIAGNOSIS — D508 Other iron deficiency anemias: Secondary | ICD-10-CM

## 2024-11-15 MED ORDER — SODIUM CHLORIDE 0.9 % IV SOLN
INTRAVENOUS | Status: DC
  Filled 2024-11-15 (×2): qty 250

## 2024-11-15 MED ORDER — SODIUM CHLORIDE 0.9 % IV SOLN
510.0000 mg | INTRAVENOUS | Status: DC
  Administered 2024-11-15: 510 mg via INTRAVENOUS
  Filled 2024-11-15: qty 510

## 2024-11-15 NOTE — Patient Instructions (Signed)

## 2024-12-13 ENCOUNTER — Inpatient Hospital Stay: Attending: Oncology

## 2024-12-13 DIAGNOSIS — D500 Iron deficiency anemia secondary to blood loss (chronic): Secondary | ICD-10-CM | POA: Diagnosis present

## 2024-12-13 DIAGNOSIS — N920 Excessive and frequent menstruation with regular cycle: Secondary | ICD-10-CM | POA: Insufficient documentation

## 2024-12-13 DIAGNOSIS — D508 Other iron deficiency anemias: Secondary | ICD-10-CM

## 2024-12-13 LAB — CBC WITH DIFFERENTIAL (CANCER CENTER ONLY)
Abs Immature Granulocytes: 0.01 K/uL (ref 0.00–0.07)
Basophils Absolute: 0.1 K/uL (ref 0.0–0.1)
Basophils Relative: 1 %
Eosinophils Absolute: 0.1 K/uL (ref 0.0–0.5)
Eosinophils Relative: 1 %
HCT: 34.8 % — ABNORMAL LOW (ref 36.0–46.0)
Hemoglobin: 11.2 g/dL — ABNORMAL LOW (ref 12.0–15.0)
Immature Granulocytes: 0 %
Lymphocytes Relative: 28 %
Lymphs Abs: 1.9 K/uL (ref 0.7–4.0)
MCH: 26.8 pg (ref 26.0–34.0)
MCHC: 32.2 g/dL (ref 30.0–36.0)
MCV: 83.3 fL (ref 80.0–100.0)
Monocytes Absolute: 0.4 K/uL (ref 0.1–1.0)
Monocytes Relative: 5 %
Neutro Abs: 4.3 K/uL (ref 1.7–7.7)
Neutrophils Relative %: 65 %
Platelet Count: 287 K/uL (ref 150–400)
RBC: 4.18 MIL/uL (ref 3.87–5.11)
Smear Review: NORMAL
WBC Count: 6.7 K/uL (ref 4.0–10.5)
nRBC: 0 % (ref 0.0–0.2)

## 2024-12-13 LAB — IRON AND TIBC
Iron: 108 ug/dL (ref 28–170)
Saturation Ratios: 35 % — ABNORMAL HIGH (ref 10.4–31.8)
TIBC: 311 ug/dL (ref 250–450)
UIBC: 203 ug/dL

## 2024-12-13 LAB — FERRITIN: Ferritin: 111 ng/mL (ref 11–307)

## 2025-02-15 ENCOUNTER — Inpatient Hospital Stay

## 2025-02-15 ENCOUNTER — Inpatient Hospital Stay: Admitting: Oncology
